# Patient Record
Sex: Female | Born: 1965 | Race: Black or African American | Hispanic: No | Marital: Married | Smoking: Current every day smoker
Health system: Southern US, Community
[De-identification: ages and names within clinical notes are randomized; demographics above are authoritative.]

## PROBLEM LIST (undated history)

## (undated) DIAGNOSIS — K829 Disease of gallbladder, unspecified: Secondary | ICD-10-CM

## (undated) DIAGNOSIS — D649 Anemia, unspecified: Secondary | ICD-10-CM

## (undated) DIAGNOSIS — M1990 Unspecified osteoarthritis, unspecified site: Secondary | ICD-10-CM

## (undated) DIAGNOSIS — R51 Headache: Secondary | ICD-10-CM

## (undated) DIAGNOSIS — G8929 Other chronic pain: Secondary | ICD-10-CM

## (undated) DIAGNOSIS — E039 Hypothyroidism, unspecified: Secondary | ICD-10-CM

## (undated) DIAGNOSIS — M549 Dorsalgia, unspecified: Secondary | ICD-10-CM

## (undated) DIAGNOSIS — M7989 Other specified soft tissue disorders: Secondary | ICD-10-CM

## (undated) DIAGNOSIS — M2550 Pain in unspecified joint: Secondary | ICD-10-CM

## (undated) DIAGNOSIS — M2540 Effusion, unspecified joint: Secondary | ICD-10-CM

## (undated) DIAGNOSIS — M069 Rheumatoid arthritis, unspecified: Secondary | ICD-10-CM

## (undated) DIAGNOSIS — I10 Essential (primary) hypertension: Secondary | ICD-10-CM

## (undated) DIAGNOSIS — E559 Vitamin D deficiency, unspecified: Secondary | ICD-10-CM

## (undated) HISTORY — DX: Essential (primary) hypertension: I10

## (undated) HISTORY — PX: ABDOMINAL HYSTERECTOMY: SHX81

## (undated) HISTORY — DX: Hypothyroidism, unspecified: E03.9

## (undated) HISTORY — DX: Rheumatoid arthritis, unspecified: M06.9

## (undated) HISTORY — PX: GALLBLADDER SURGERY: SHX652

## (undated) HISTORY — DX: Unspecified osteoarthritis, unspecified site: M19.90

## (undated) HISTORY — DX: Anemia, unspecified: D64.9

## (undated) HISTORY — PX: KNEE ARTHROSCOPY: SUR90

## (undated) HISTORY — DX: Vitamin D deficiency, unspecified: E55.9

## (undated) HISTORY — DX: Dorsalgia, unspecified: M54.9

## (undated) HISTORY — DX: Other specified soft tissue disorders: M79.89

## (undated) HISTORY — DX: Disease of gallbladder, unspecified: K82.9

---

## 1997-08-06 ENCOUNTER — Encounter: Admission: RE | Admit: 1997-08-06 | Discharge: 1997-11-04 | Payer: Self-pay | Admitting: Internal Medicine

## 1998-01-17 ENCOUNTER — Encounter: Admission: RE | Admit: 1998-01-17 | Discharge: 1998-04-17 | Payer: Self-pay | Admitting: Internal Medicine

## 1998-10-23 ENCOUNTER — Encounter: Payer: Self-pay | Admitting: Emergency Medicine

## 1998-10-23 ENCOUNTER — Emergency Department (HOSPITAL_COMMUNITY): Admission: EM | Admit: 1998-10-23 | Discharge: 1998-10-23 | Payer: Self-pay | Admitting: Emergency Medicine

## 1998-12-12 ENCOUNTER — Encounter (INDEPENDENT_AMBULATORY_CARE_PROVIDER_SITE_OTHER): Payer: Self-pay

## 1998-12-12 ENCOUNTER — Inpatient Hospital Stay (HOSPITAL_COMMUNITY): Admission: AD | Admit: 1998-12-12 | Discharge: 1998-12-14 | Payer: Self-pay | Admitting: *Deleted

## 1999-12-19 ENCOUNTER — Other Ambulatory Visit: Admission: RE | Admit: 1999-12-19 | Discharge: 1999-12-19 | Payer: Self-pay | Admitting: *Deleted

## 2002-08-21 ENCOUNTER — Other Ambulatory Visit: Admission: RE | Admit: 2002-08-21 | Discharge: 2002-08-21 | Payer: Self-pay | Admitting: *Deleted

## 2003-10-26 ENCOUNTER — Other Ambulatory Visit: Admission: RE | Admit: 2003-10-26 | Discharge: 2003-10-26 | Payer: Self-pay | Admitting: Obstetrics and Gynecology

## 2004-09-06 ENCOUNTER — Encounter: Admission: RE | Admit: 2004-09-06 | Discharge: 2004-09-06 | Payer: Self-pay | Admitting: Internal Medicine

## 2006-08-12 ENCOUNTER — Encounter (INDEPENDENT_AMBULATORY_CARE_PROVIDER_SITE_OTHER): Payer: Self-pay | Admitting: Diagnostic Radiology

## 2006-08-12 ENCOUNTER — Other Ambulatory Visit: Admission: RE | Admit: 2006-08-12 | Discharge: 2006-08-12 | Payer: Self-pay | Admitting: Diagnostic Radiology

## 2006-08-12 ENCOUNTER — Encounter: Admission: RE | Admit: 2006-08-12 | Discharge: 2006-08-12 | Payer: Self-pay | Admitting: Endocrinology

## 2006-12-10 ENCOUNTER — Ambulatory Visit (HOSPITAL_BASED_OUTPATIENT_CLINIC_OR_DEPARTMENT_OTHER): Admission: RE | Admit: 2006-12-10 | Discharge: 2006-12-10 | Payer: Self-pay | Admitting: Orthopedic Surgery

## 2007-05-06 ENCOUNTER — Encounter: Admission: RE | Admit: 2007-05-06 | Discharge: 2007-05-06 | Payer: Self-pay | Admitting: Endocrinology

## 2007-06-07 ENCOUNTER — Encounter (INDEPENDENT_AMBULATORY_CARE_PROVIDER_SITE_OTHER): Payer: Self-pay | Admitting: Interventional Radiology

## 2007-06-07 ENCOUNTER — Encounter: Admission: RE | Admit: 2007-06-07 | Discharge: 2007-06-07 | Payer: Self-pay | Admitting: Endocrinology

## 2007-06-07 ENCOUNTER — Other Ambulatory Visit: Admission: RE | Admit: 2007-06-07 | Discharge: 2007-06-07 | Payer: Self-pay | Admitting: Interventional Radiology

## 2007-08-19 ENCOUNTER — Ambulatory Visit (HOSPITAL_BASED_OUTPATIENT_CLINIC_OR_DEPARTMENT_OTHER): Admission: RE | Admit: 2007-08-19 | Discharge: 2007-08-19 | Payer: Self-pay | Admitting: Orthopedic Surgery

## 2009-04-30 ENCOUNTER — Encounter: Admission: RE | Admit: 2009-04-30 | Discharge: 2009-04-30 | Payer: Self-pay | Admitting: Endocrinology

## 2010-02-23 ENCOUNTER — Encounter: Payer: Self-pay | Admitting: Endocrinology

## 2010-02-24 ENCOUNTER — Encounter: Payer: Self-pay | Admitting: Endocrinology

## 2010-06-17 NOTE — Op Note (Signed)
NAMECLINTON, WAHLBERG NO.:  [id number]   MEDICAL RECORD NO.:  [id number]          PATIENT TYPE:  AMB   LOCATION:  DSC                          FACILITY:  [facility]   PHYSICIAN:  Harvie Junior, M.D.   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   POSTOPERATIVE DIAGNOSES:  1. 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  2. Tight lateral retinaculum.   PRINCIPAL PROCEDURES:  1. Right knee arthroscopy with debridement of posterior horn medial      meniscus and corresponding debridement in the medial femoral      compartment.  2. Debridement of chondromalacia of patella.  3. Lateral retinacular release, arthroscopic.   SURGEON:  Harvie Junior, M.D.   ASSISTANT:  Marshia Ly, P.A.   ANESTHESIA:  General.   BRIEF HISTORY:  Ms. Altizer is a 45 year old female with a long history of  having had significant right knee pain.  We treated her conservatively  for a long period of time.  Because of continuing complaints of pain, we  ultimately talked about treatment options including arthroscopic  intervention.  MRI was not obtained as preoperatively we did not feel it  was going to add much to the case.  Ultimately we felt she had a medial  meniscal tear and some chondromalacia of the patella.  She was brought  to the operating room  for arthroscopic intervention.   PROCEDURE:  The patient was brought to the operating room.  After  adequate anesthesia was obtained with general anesthetic, the patient  was placed up on the operating table.  The right leg was prepped and  draped in the usual sterile fashion.  Following this, routine  arthroscopic examination of the knee revealed there was an obvious  posterior horn medial meniscal tear, radial in nature.  This was  debrided back to a smooth and  stable rim.   Attention was turned to the medial femoral condyle which had some grade  2 and grade 3 changes, it was debrided to a smooth rim of articular  cartilage.  Attention was turned to the notch where the ACL was normal.  Lateral side showed some grade 1 and grade 2 chondromalacia.  The  lateral meniscus was within normal limits, this was left alone.   Attention was turned up to the patellofemoral joint where there was some  grade 3 chondromalacia that was debrided back to a stable rim and then  attention was turned to the lateral retinaculum where the lateral  retinaculum was identified and released from 3 cm proximal to the  patella down to the joint line.  Once this was completed, the knee  was copiously and thoroughly irrigated and suctioned dry.  Arthroscopic  portals were closed with a bandage.  A sterile compression dressing was  applied.  The patient was taken to the recovery where she was noted to  be in satisfactory condition.  Estimated blood loss  through the  procedure was nothing.      Harvie Junior, M.D.  Electronically Signed     JLG/MEDQ  D:  12/10/2006  T:  12/10/2006  Job:  [id number]   cc:   Harvie Junior, M.D.

## 2010-06-17 NOTE — Op Note (Signed)
[name], [name] NO.:  [id number]   MEDICAL RECORD NO.:  [id number]          PATIENT TYPE:  AMB   LOCATION:  DSC                          FACILITY:  [facility]   PHYSICIAN:  Harvie Junior, M.D.   DATE OF BIRTH:  07/07/1965   DATE OF PROCEDURE:  DATE OF DISCHARGE:                               OPERATIVE REPORT   PREOPERATIVE DIAGNOSES:  1. Medial meniscus tear  2. Tight right lateral retinaculum.   POSTOPERATIVE DIAGNOSES:  1. Medial meniscus tear  2. Tight right lateral retinaculum.  3. Chondromalacia patella.  4. Chondromalacia lateral femoral condyle.   PROCEDURE:  1. Operative knee arthroscopy.  Partial and posterior horn medial      meniscectomy.  2. Chondroplasty lateral compartment.  3. Chondroplasty patellofemoral compartment.  4. Lateral retinacular release.   SURGEON:  Harvie Junior, MD   ASSISTANT:  None.   ANESTHESIA:  General.   BRIEF HISTORY:  [name] Hughes is a 45 year old female with a long history of  having had significant bilateral knee pain.  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  Her left knee began hurting as similar to the right knee  and we talked about conservative treatment options, but ultimately she  felt that surgical intervention had worked well for her before, and she  was brought to the operating room for this procedure.   PROCEDURE:  The patient was brought to the operating room and after  adequate anesthesia was obtained with general anesthetic, the patient  was placed supine on the operating table.  The left leg was then prepped  and draped in the usual sterile fashion.  Following this, routine  arthroscopic examination of the knee revealed there was an obvious  posterior horn medial meniscal tear.  This was at the meniscal root and  one around to the posterior horn.  About 20% of the posterior horn of  the medial meniscus was removed down to a  smooth and stable rim.  There  was grade 2 and 3 change on the medial femoral condyle, which was  debrided back to a smooth and stable rim.  ACL was normal.  Lateral side  showed a small area of grade 2 change, which was really almost like a  flap, which was derided back to a smooth and stable rim.  Lateral  meniscus was normal.  Attention was turned out to the patellofemoral  joint.  There were grade 3 and 4 changes on the posterior aspect of the  patella and in the trochlea.  Because of this severe disease, we felt  that the lateral retinacular release was appropriate, and lateral  retinacular release was performed from the lateral compartment to help  centralize the patellar tracking and take pressure off the  patellofemoral joint.  This was performed from 3 fingerbreadths proximal  to the patella down to the joint line.  Once this was completed, the  knee was copiously and thoroughly irrigated and suctioned dried.  All  sterile portals were closed with a  bandage.  Final check was made for  loosening fragment and pieces, seeing none.  The knee was copiously and  thoroughly irrigated and suctioned dried.  All sterile portals were  closed with a bandage.  Sterile compression dressing was applied.  The  patient was taken to the recovery room.  She was noted to be in a  satisfactory condition.  Prior to bandage placement, 20 mL of 0.25%  Marcaine was instilled into the knee for postoperative anesthesia.      Harvie Junior, M.D.  Electronically Signed     JLG/MEDQ  D:  08/19/2007  T:  08/20/2007  Job:  [id number]

## 2010-06-20 NOTE — H&P (Signed)
[facility]  Patient:    [name] Hughes                         MRN: [id number] Adm. Date:  [id number] Attending:  Pleas Koch                         History and Physical  ADMISSION DIAGNOSIS:          Fibroid uterus and menorrhagia.  HISTORY OF PRESENT ILLNESS:   This is a 45 year old, gravida 1, para 1, status ost tubal ligation with a long history of fibroids dating back to 41.  She had submucous myoma resection with resectoscope in 1993.  She had recurrence of the  fibroids with heavy bleeding after deliverng an infant in 1995.  Bleeding was not controlled with oral contraceptives which caused a moodiness side effects and still irregular bleeding.  An ultrasound showed multiple fibroids with an endometrial  stripe of 7.5 mm.  The patient desired definitive therapy.  A total abdominal hysterectomy is scheduled.  PAST MEDICAL HISTORY:         Cholecystectomy, postpartum tubal ligation and cesarean section in 1995.  Submucous myoma resection in 1993.  No other active medical problems.  FAMILY HISTORY:               Positive diabetes and positive stroke.  ALLERGIES:                    No known drug allergies.  MEDICATIONS:                  None.  REVIEW OF SYSTEMS:            Otherwise negative.  SOCIAL HISTORY:               No smoking or alcohol.  No recreational drugs. The patient is of the [place] faith.  PHYSICAL EXAMINATION:  VITAL SIGNS:                  Temperature 97.9, blood pressure 110/80, respiratory rate 16, pulse rate 80.  HEENT:                        Normal.  GENERAL:                      The patient is a well-developed, obese black female.  NECK:                         No thyromegaly or JVD.  LUNGS:                        Clear to auscultation and percussion.  HEART:                        Regular rate and rhythm with no murmurs.  BREASTS:                      Without masses.  ABDOMEN:                       Obese, no masses, no CVA tenderness, no organomegaly and no adenopathy.  PELVIC:  Normal external genitals, normal vagina, and normal cervix.  Uterus 8 to 10 weeks size, irregular, mobile, consistent with fibroids. No adnexal masses.  No rectal masses.  EXTREMITIES:                  Without cyanosis or edema.  SKIN:                         Normal.  LABORATORY DATA:              Admission hemoglobin 11.6, WBC 8.7, platelet count 291,000.  Urinalysis negative.  ADMISSION DIAGNOSIS:          Fibroid uterus, menorrhagia, and anemia.  PLAN:                         The patient is admitted for total abdominal hysterectomy, aware of risks and complications of surgery including complications due to morbid obesity, complications due to bleeding and infections, bowel and bladder injuries, fistula formation, anesthetic complications, and blood clots.  She accepts these and is willing to proceed.  Surgery is scheduled for December 12, 1998. DD:  12/12/98 TD:  12/12/98 Job: [id number]

## 2010-06-20 NOTE — Op Note (Signed)
[facility]  Patient:    [name] Hughes                         MRN: [id number] Proc. Date: 12/12/98 Adm. Date:  [id number] Attending:  Pleas Koch                           Operative Report  PREOPERATIVE DIAGNOSIS:       Menorrhagia, fibroid uterus.  POSTOPERATIVE DIAGNOSIS:      Menorrhagia, fibroid uterus.  OPERATION:                    Total abdominal hysterectomy.  SURGEON:                      Georgina Peer, M.D.  ASSISTANT:                    Henreitta Leber, P.A.-C.  ANESTHESIA:                   General anesthesia by Belva Agee, M.D.  ESTIMATED BLOOD LOSS:         300 cc.  COMPLICATIONS:                None.  On-Q pump used using 0.25% Marcaine.  FINDINGS:                     8-week size fibroid uterus, normal ovaries.  INDICATIONS:                  A 45 year old, gravida 1, para 1, with a long history of fibroids, symptomatic menorrhagia unresponsive to medical treatment.  She is  brought in for abdominal hysterectomy.  DESCRIPTION OF PROCEDURE:     The patient was taken to the operating room and given a general anesthesia and placed in the dorsal supine position.  Her abdomen, perineum, and vagina were prepped and draped and a Foley catheter placed in her  bladder.  A transverse skin incision was made through the previous Pfannenstiel  incision from her cesarean section.  This was taken down through the subcutaneous Scarpas layer and the fascia was identified.  Bleeders were cauterized.  The fascia was divided and the muscles separated in the midline.  The peritoneum opened sharply.  The peritoneal incision was widened and there was a small omental adhesion from the subumbilical incision which was lysed.  There was no evidence of bowel injury.  The tubes and ovaries appeared normal.  The tubal stump was adherent to a portion of the descending colon and this was dissected free.  The uterus was mobile.   Multiple fibroids were noted.  The bowel was packed away and the patient placed in Trendelenburg.  Balfour retractors were placed prior to this and the bladder blade placed.  The uterus was elevated with two long Kelly clamps, the right round ligament clamped, cut, and suture ligated.  The right utero-ovarian  ligament isolated, clamped, cut, and suture ligated, and free tied.  The uterine vessels on the right were skeletonized and the bladder flap was created and the  bladder pushed away.  The left round ligament was clamped, cut, and suture ligated. The left utero-ovarian ligament was clamped, cut, and suture ligated and then free tied.  The uterine vessels were skeletonized on the left.  The bladder flap was met  in the midline and the bladder pushed down and cervical fascia noted.  The right uterine vessels were identified, clamped with a bag clamp, cut, and suture ligated. The left uterine vessels were identified, clamped, cut, and suture ligated. Progressive bites of the cardinal ligament incorporating the vascular structures were clamped, cut, and suture ligated.  Because the uterus was bulky and visualization was more difficult, once the uterine vessels had been amputated, he fundus of the uterus was amputated and the cervical stump remained.  The uterosacral ligaments were identified, clamped, cut, and suture ligated at the level of the cervicovaginal angles.  The clamps entered the vagina and the cervical stump was cut free of the vaginal apex using curved scissors.  The corner sutures of Vicryl used to secure the corners of the midportion was whip stitched with a  running locked suture of Vicryl and closed in the middle with a figure-of-eight  suture.  There was excellent hemostasis.  The pelvis was irrigated.  At this point, the uterosacral ligaments were tied together.  The ovaries were found to be well suspended.  There was no bleeding.  Blood and debris were  removed.  The packs and retractors were removed.  The muscles approximated with two interrupted sutures. The fascia closed with two sutures of Vicryl and tied in the midline.  The subcutaneous tissue and Scarpas was freed where it was previously adherent.  An  On-Q catheter was placed three fingerbreadths above the right edge of the incision. 30 cc of 0.25% Marcaine was injected into the subcutaneous tissue and subcuticular area.  An On-Q catheter was threaded through the angiocath insertion port and connected to an On-Q pump per On-Q protocol.  The subcutaneous tissue was closed with a running suture of chromic and the skin closed with skin staples.  The patient had received 1 gram of Ancef preoperatively and PAS stockings knee-high  were in place the total time of the operation.  Sponge, needle, and instrument counts were correct at all times.  The patient was returned to the recovery room in good condition. DD:  12/12/98 TD:  12/12/98 Job: [id number]

## 2010-06-26 ENCOUNTER — Other Ambulatory Visit: Payer: Self-pay | Admitting: Endocrinology

## 2010-06-26 DIAGNOSIS — E041 Nontoxic single thyroid nodule: Secondary | ICD-10-CM

## 2010-10-31 LAB — POCT I-STAT, CHEM 8
BUN: 9
Calcium, Ion: 1.17
Chloride: 101
Creatinine, Ser: 0.7
Glucose, Bld: 89

## 2010-10-31 LAB — POCT HEMOGLOBIN-HEMACUE: Hemoglobin: 11.8 — ABNORMAL LOW

## 2010-11-11 LAB — POCT HEMOGLOBIN-HEMACUE
Hemoglobin: 12.1
Operator id: 128471

## 2010-11-11 LAB — BASIC METABOLIC PANEL
BUN: 8
CO2: 30
Chloride: 104
GFR calc Af Amer: >60
Potassium: 3.1 — ABNORMAL LOW

## 2010-12-15 ENCOUNTER — Other Ambulatory Visit: Payer: Self-pay

## 2013-06-29 ENCOUNTER — Ambulatory Visit: Payer: Self-pay | Admitting: Cardiovascular Disease

## 2013-07-21 ENCOUNTER — Encounter: Payer: Self-pay | Admitting: Cardiovascular Disease

## 2013-07-21 ENCOUNTER — Ambulatory Visit (INDEPENDENT_AMBULATORY_CARE_PROVIDER_SITE_OTHER): Payer: Managed Care, Other (non HMO) | Admitting: Cardiovascular Disease

## 2013-07-21 VITALS — BP 90/66 | HR 64 | Ht 65.5 in | Wt 279.0 lb

## 2013-07-21 DIAGNOSIS — R079 Chest pain, unspecified: Secondary | ICD-10-CM

## 2013-07-21 DIAGNOSIS — I10 Essential (primary) hypertension: Secondary | ICD-10-CM | POA: Diagnosis not present

## 2013-07-21 NOTE — Assessment & Plan Note (Signed)
Under good control on current medications 

## 2013-07-21 NOTE — Patient Instructions (Signed)
Your physician recommends that you schedule a follow-up appointment in: As Needed    

## 2013-07-21 NOTE — Assessment & Plan Note (Signed)
She relates new onset chest pain during the month of May  once or twice a week occurring up to 30 minutes a time without associated symptoms. She really has no risk factors other than hypertension. She's had no recurrent symptoms. At this point I'm not convinced that her pain is ischemic. Will not pursue further workup unless she has recurrent symptoms.

## 2013-07-21 NOTE — Progress Notes (Signed)
     07/21/2013 [name] AlstromJanice D Hughes   1965/05/26  [id number]  Primary Physician Londell MohPHARR,[name] DAVIDSON, Hughes Primary Cardiologist: [name] GessJonathan J. Berry Hughes [name] RenoFACP,FACC,FAHA, FSCAI   HPI:  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 She was referred by Dr. Renne CriglerPharr for cardiovascular evaluation because of atypical chest pain. Her cardiovascular risk factor profile is only positive for hypertension and tobacco abuse smoking one half pack per day. There is no family history. She's not diabetic or hyperlipidemic. She's never had a heart for stroke. She had new-onset chest pain during the month of May Kring wants to person month lasting up to 30 minutes a time without associated symptoms. She has had no recurrent symptoms.   Current Outpatient Prescriptions  Medication Sig Dispense Refill  . SYNTHROID 112 MCG tablet Take 112 mcg by mouth daily before breakfast.       . valsartan-hydrochlorothiazide (DIOVAN-HCT) 320-25 MG per tablet Take 1 tablet by mouth daily.        No current facility-administered medications for this visit.    No Known Allergies  History   Social History  . Marital Status: Married    Spouse Name: N/A    Number of Children: N/A  . Years of Education: N/A   Occupational History  . Not on file.   Social History Main Topics  . Smoking status: Current Every Day Smoker  . Smokeless tobacco: Not on file  . Alcohol Use: Not on file  . Drug Use: Not on file  . Sexual Activity: Not on file   Other Topics Concern  . Not on file   Social History Narrative  . No narrative on file     Review of Systems: General: negative for chills, fever, night sweats or weight changes.  Cardiovascular: negative for chest pain, dyspnea on exertion, edema, orthopnea, palpitations, paroxysmal nocturnal dyspnea or shortness of breath Dermatological: negative for rash Respiratory: negative for cough or wheezing Urologic:  negative for hematuria Abdominal: negative for nausea, vomiting, diarrhea, bright red blood per rectum, melena, or hematemesis Neurologic: negative for visual changes, syncope, or dizziness All other systems reviewed and are otherwise negative except as noted above.    Blood pressure 90/66, pulse 64, height 5' 5.5" (1.664 m), weight 279 lb (126.554 kg).  General appearance: alert and no distress Neck: no adenopathy, no carotid bruit, no JVD, supple, symmetrical, trachea midline and thyroid not enlarged, symmetric, no tenderness/mass/nodules Lungs: clear to auscultation bilaterally Heart: regular rate and rhythm, S1, S2 normal, no murmur, click, rub or gallop Extremities: extremities normal, atraumatic, no cyanosis or edema and 2+ pedal pulses bilaterally  EKG /was 64 with no ST or T wave changes  ASSESSMENT AND PLAN:   Essential hypertension Under good control on current medications  Chest pain She relates new onset chest pain during the month of May  once or twice a week occurring up to 30 minutes a time without associated symptoms. She really has no risk factors other than hypertension. She's had no recurrent symptoms. At this point I'm not convinced that her pain is ischemic. Will not pursue further workup unless she has recurrent symptoms.      [name] GessJonathan J. Berry Hughes FACP,FACC,FAHA, [facility] CampusFSCAI 07/21/2013 4:45 PM

## 2013-09-19 ENCOUNTER — Other Ambulatory Visit: Payer: Self-pay | Admitting: Orthopedic Surgery

## 2013-09-22 ENCOUNTER — Encounter (HOSPITAL_COMMUNITY): Payer: Self-pay | Admitting: Pharmacy Technician

## 2013-09-28 ENCOUNTER — Encounter (HOSPITAL_COMMUNITY)
Admission: RE | Admit: 2013-09-28 | Discharge: 2013-09-28 | Disposition: A | Discharge status: Home / Self Care | Payer: Managed Care, Other (non HMO) | Source: Ambulatory Visit | Attending: Orthopedic Surgery | Admitting: Orthopedic Surgery

## 2013-09-28 ENCOUNTER — Encounter (HOSPITAL_COMMUNITY): Payer: Self-pay

## 2013-09-28 DIAGNOSIS — M1710 Unilateral primary osteoarthritis, unspecified knee: Secondary | ICD-10-CM | POA: Diagnosis present

## 2013-09-28 DIAGNOSIS — Z01818 Encounter for other preprocedural examination: Secondary | ICD-10-CM | POA: Insufficient documentation

## 2013-09-28 HISTORY — DX: Dorsalgia, unspecified: M54.9

## 2013-09-28 HISTORY — DX: Headache: R51

## 2013-09-28 HISTORY — DX: Unspecified osteoarthritis, unspecified site: M19.90

## 2013-09-28 HISTORY — DX: Pain in unspecified joint: M25.50

## 2013-09-28 HISTORY — DX: Effusion, unspecified joint: M25.40

## 2013-09-28 HISTORY — DX: Other chronic pain: G89.29

## 2013-09-28 LAB — URINALYSIS, ROUTINE W REFLEX MICROSCOPIC
BILIRUBIN URINE: NEGATIVE
Glucose, UA: NEGATIVE mg/dL
HGB URINE DIPSTICK: NEGATIVE
Ketones, ur: NEGATIVE mg/dL
Leukocytes, UA: NEGATIVE
Nitrite: NEGATIVE
PROTEIN: NEGATIVE mg/dL
Specific Gravity, Urine: 1.021 (ref 1.005–1.030)
UROBILINOGEN UA: 0.2 mg/dL (ref 0.0–1.0)
pH: 6 (ref 5.0–8.0)

## 2013-09-28 LAB — CBC WITH DIFFERENTIAL/PLATELET
BASOS PCT: 0 % (ref 0–1)
Basophils Absolute: 0 10*3/uL (ref 0.0–0.1)
EOS ABS: 0.2 10*3/uL (ref 0.0–0.7)
EOS PCT: 3 % (ref 0–5)
HCT: 35.4 % — ABNORMAL LOW (ref 36.0–46.0)
HEMOGLOBIN: 11.8 g/dL — AB (ref 12.0–15.0)
LYMPHS ABS: 2.6 10*3/uL (ref 0.7–4.0)
Lymphocytes Relative: 34 % (ref 12–46)
MCH: 28.5 pg (ref 26.0–34.0)
MCHC: 33.3 g/dL (ref 30.0–36.0)
MCV: 85.5 fL (ref 78.0–100.0)
MONO ABS: 0.6 10*3/uL (ref 0.1–1.0)
MONOS PCT: 8 % (ref 3–12)
NEUTROS PCT: 55 % (ref 43–77)
Neutro Abs: 4.2 10*3/uL (ref 1.7–7.7)
Platelets: 209 10*3/uL (ref 150–400)
RBC: 4.14 MIL/uL (ref 3.87–5.11)
RDW: 13.2 % (ref 11.5–15.5)
WBC: 7.6 10*3/uL (ref 4.0–10.5)

## 2013-09-28 LAB — COMPREHENSIVE METABOLIC PANEL
ALBUMIN: 3 g/dL — AB (ref 3.5–5.2)
ALT: 12 U/L (ref 0–35)
AST: 16 U/L (ref 0–37)
Alkaline Phosphatase: 98 U/L (ref 39–117)
Anion gap: 11 (ref 5–15)
BUN: 11 mg/dL (ref 6–23)
CALCIUM: 9.3 mg/dL (ref 8.4–10.5)
CO2: 28 mEq/L (ref 19–32)
CREATININE: 0.86 mg/dL (ref 0.50–1.10)
Chloride: 98 mEq/L (ref 96–112)
GFR calc Af Amer: >90 mL/min (ref >90)
GFR calc non Af Amer: 79 mL/min — ABNORMAL LOW (ref >90)
GLUCOSE: 89 mg/dL (ref 70–99)
POTASSIUM: 3.8 meq/L (ref 3.7–5.3)
Sodium: 137 mEq/L (ref 137–147)
Total Bilirubin: 0.3 mg/dL (ref 0.3–1.2)
Total Protein: 7.3 g/dL (ref 6.0–8.3)

## 2013-09-28 LAB — APTT: aPTT: 33 seconds (ref 24–37)

## 2013-09-28 LAB — ABO/RH: ABO/RH(D): O POS

## 2013-09-28 LAB — SURGICAL PCR SCREEN
MRSA, PCR: NEGATIVE
Staphylococcus aureus: NEGATIVE

## 2013-09-28 LAB — TYPE AND SCREEN
ABO/RH(D): O POS
ANTIBODY SCREEN: NEGATIVE

## 2013-09-28 LAB — PROTIME-INR
INR: 1.08 (ref 0.00–1.49)
PROTHROMBIN TIME: 14 s (ref 11.6–15.2)

## 2013-09-28 MED ORDER — CHLORHEXIDINE GLUCONATE 4 % EX LIQD: 60.0000 mL | Freq: Once | CUTANEOUS | Status: DC

## 2013-09-28 NOTE — Pre-Procedure Instructions (Signed)
[name] Hughes  09/28/2013   Your procedure is scheduled on: Fri, Sept 4 @ 10:00 AM  Report to [facility]ance A at 8:00 AM.  Call this number if you have problems the morning of surgery: [phone]   Remember:   Do not eat food or drink liquids after midnight.   Take these medicines the morning of surgery with A SIP OF WATER: Pain Pill(if needed) and Synthroid              Stop taking your Ibuprofen. No Goody's,BC's,Aleve,Aspirin,Fish Oil,or any Herbal Medications   Do not wear jewelry, make-up or nail polish.  Do not wear lotions, powders, or perfumes. You may wear deodorant.  Do not shave 48 hours prior to surgery.   Do not bring valuables to the hospital.  [facility] is not responsible                  for any belongings or valuables.               Contacts, dentures or bridgework may not be worn into surgery.  Leave suitcase in the car. After surgery it may be brought to your room.  For patients admitted to the hospital, discharge time is determined by your                treatment team.               Special Instructions:  [place] - Preparing for Surgery  Before surgery, you can play an important role.  Because skin is not sterile, your skin needs to be as free of germs as possible.  You can reduce the number of germs on you skin by washing with CHG (chlorahexidine gluconate) soap before surgery.  CHG is an antiseptic cleaner which kills germs and bonds with the skin to continue killing germs even after washing.  Please DO NOT use if you have an allergy to CHG or antibacterial soaps.  If your skin becomes reddened/irritated stop using the CHG and inform your nurse when you arrive at Short Stay.  Do not shave (including legs and underarms) for at least 48 hours prior to the first CHG shower.  You may shave your face.  Please follow these instructions carefully:   1.  Shower with CHG Soap the night before surgery and the                                morning of  Surgery.  2.  If you choose to wash your hair, wash your hair first as usual with your       normal shampoo.  3.  After you shampoo, rinse your hair and body thoroughly to remove the                      Shampoo.  4.  Use CHG as you would any other liquid soap.  You can apply chg directly       to the skin and wash gently with scrungie or a clean washcloth.  5.  Apply the CHG Soap to your body ONLY FROM THE NECK DOWN.        Do not use on open wounds or open sores.  Avoid contact with your eyes,       ears, mouth and genitals (private parts).  Wash genitals (private parts)       with your  normal soap.  6.  Wash thoroughly, paying special attention to the area where your surgery        will be performed.  7.  Thoroughly rinse your body with warm water from the neck down.  8.  DO NOT shower/wash with your normal soap after using and rinsing off       the CHG Soap.  9.  Pat yourself dry with a clean towel.            10.  Wear clean pajamas.            11.  Place clean sheets on your bed the night of your first shower and do not        sleep with pets.  Day of Surgery  Do not apply any lotions/deoderants the morning of surgery.  Please wear clean clothes to the hospital/surgery center.     Please read over the following fact sheets that you were given: Pain Booklet, Coughing and Deep Breathing, Blood Transfusion Information, MRSA Information and Surgical Site Infection Prevention

## 2013-09-28 NOTE — Progress Notes (Addendum)
Saw Dr.Berry in June 2015-visit in epic but doesn't have to be seen again  Denies ever having an echo/stress test/heart cath  Denies CXR in past yr    EKG in epic from 07-21-13  Medical Md is Dr.Walter Renne Crigler

## 2013-10-05 MED ORDER — DEXTROSE 5 % IV SOLN
3.0000 g | INTRAVENOUS | Status: AC
  Administered 2013-10-06: 3 g via INTRAVENOUS
  Filled 2013-10-05: qty 3000

## 2013-10-05 NOTE — Progress Notes (Signed)
Instructed patient to arrive at 530 am 10-06-13.

## 2013-10-05 NOTE — Anesthesia Preprocedure Evaluation (Addendum)
Anesthesia Evaluation  Patient identified by MRN, date of birth, ID band Patient awake    Reviewed: Allergy & Precautions, H&P , NPO status , Patient's Chart, lab work & pertinent test results  History of Anesthesia Complications Negative for: history of anesthetic complications  Airway Mallampati: II TM Distance: >3 FB Neck ROM: Full    Dental  (+) Teeth Intact, Dental Advisory Given   Pulmonary Current Smoker,  breath sounds clear to auscultation        Cardiovascular hypertension, Pt. 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)   Renal/GU negative Renal ROS     Musculoskeletal   Abdominal   Peds  Hematology   Anesthesia Other Findings   Reproductive/Obstetrics negative OB ROS                         Anesthesia Physical Anesthesia Plan  ASA: III  Anesthesia Plan: General   Post-op Pain Management: MAC Combined w/ Regional for Post-op pain   Induction: Intravenous  Airway Management Planned: Oral ETT  Additional Equipment:   Intra-op Plan:   Post-operative Plan:   Informed Consent: I have reviewed the patients History and Physical, chart, labs and discussed the procedure including the risks, benefits and alternatives for the proposed anesthesia with the patient or authorized representative who has indicated his/her understanding and acceptance.     Plan Discussed with:   Anesthesia Plan Comments:         Anesthesia Quick Evaluation

## 2013-10-06 ENCOUNTER — Encounter (HOSPITAL_COMMUNITY)
Admission: RE | Disposition: A | Discharge status: Home / Self Care | Payer: Self-pay | Source: Ambulatory Visit | Attending: Orthopedic Surgery

## 2013-10-06 ENCOUNTER — Encounter (HOSPITAL_COMMUNITY): Payer: Self-pay | Admitting: *Deleted

## 2013-10-06 ENCOUNTER — Inpatient Hospital Stay (HOSPITAL_COMMUNITY): Payer: Managed Care, Other (non HMO) | Admitting: Anesthesiology

## 2013-10-06 ENCOUNTER — Encounter (HOSPITAL_COMMUNITY): Payer: Managed Care, Other (non HMO) | Admitting: Anesthesiology

## 2013-10-06 ENCOUNTER — Inpatient Hospital Stay (HOSPITAL_COMMUNITY)
Admission: RE | Admit: 2013-10-06 | Discharge: 2013-10-07 | DRG: 470 | Disposition: A | Discharge status: Home / Self Care | Payer: Managed Care, Other (non HMO) | Source: Ambulatory Visit | Attending: Orthopedic Surgery | Admitting: Orthopedic Surgery

## 2013-10-06 DIAGNOSIS — E039 Hypothyroidism, unspecified: Secondary | ICD-10-CM | POA: Diagnosis present

## 2013-10-06 DIAGNOSIS — Z6841 Body Mass Index (BMI) 40.0 and over, adult: Secondary | ICD-10-CM | POA: Diagnosis not present

## 2013-10-06 DIAGNOSIS — M1710 Unilateral primary osteoarthritis, unspecified knee: Principal | ICD-10-CM | POA: Diagnosis present

## 2013-10-06 DIAGNOSIS — M1712 Unilateral primary osteoarthritis, left knee: Secondary | ICD-10-CM | POA: Diagnosis present

## 2013-10-06 DIAGNOSIS — F17200 Nicotine dependence, unspecified, uncomplicated: Secondary | ICD-10-CM | POA: Diagnosis present

## 2013-10-06 DIAGNOSIS — I10 Essential (primary) hypertension: Secondary | ICD-10-CM | POA: Diagnosis present

## 2013-10-06 DIAGNOSIS — M25569 Pain in unspecified knee: Secondary | ICD-10-CM | POA: Diagnosis present

## 2013-10-06 HISTORY — PX: TOTAL KNEE ARTHROPLASTY: SHX125

## 2013-10-06 SURGERY — ARTHROPLASTY, KNEE, TOTAL
Anesthesia: General | Site: Knee | Laterality: Left

## 2013-10-06 MED ORDER — LACTATED RINGERS IV SOLN
INTRAVENOUS | Status: DC | PRN
  Administered 2013-10-06 (×3): via INTRAVENOUS

## 2013-10-06 MED ORDER — BUPIVACAINE HCL (PF) 0.25 % IJ SOLN
INTRAMUSCULAR | Status: AC
  Filled 2013-10-06: qty 30

## 2013-10-06 MED ORDER — ONDANSETRON HCL 4 MG/2ML IJ SOLN: 4.0000 mg | Freq: Four times a day (QID) | INTRAMUSCULAR | Status: DC | PRN

## 2013-10-06 MED ORDER — KETAMINE HCL 10 MG/ML IJ SOLN
INTRAMUSCULAR | Status: DC | PRN
  Administered 2013-10-06 (×2): 20 mg via INTRAVENOUS

## 2013-10-06 MED ORDER — PROMETHAZINE HCL 25 MG/ML IJ SOLN: 12.5000 mg | Freq: Four times a day (QID) | INTRAMUSCULAR | Status: DC | PRN

## 2013-10-06 MED ORDER — POLYETHYLENE GLYCOL 3350 17 G PO PACK: 17.0000 g | PACK | Freq: Every day | ORAL | Status: DC | PRN

## 2013-10-06 MED ORDER — CEFUROXIME SODIUM 1.5 G IJ SOLR
INTRAMUSCULAR | Status: AC
Start: 2013-10-06 — End: 2013-10-06
  Filled 2013-10-06: qty 1.5

## 2013-10-06 MED ORDER — GLYCOPYRROLATE 0.2 MG/ML IJ SOLN
INTRAMUSCULAR | Status: DC | PRN
  Administered 2013-10-06: 0.4 mg via INTRAVENOUS

## 2013-10-06 MED ORDER — METHOCARBAMOL 500 MG PO TABS
500.0000 mg | ORAL_TABLET | Freq: Four times a day (QID) | ORAL | Status: DC | PRN
  Administered 2013-10-06 – 2013-10-07 (×3): 500 mg via ORAL
  Filled 2013-10-06 (×3): qty 1

## 2013-10-06 MED ORDER — DIPHENHYDRAMINE HCL 12.5 MG/5ML PO ELIX: 12.5000 mg | ORAL_SOLUTION | ORAL | Status: DC | PRN

## 2013-10-06 MED ORDER — DOCUSATE SODIUM 100 MG PO CAPS
100.0000 mg | ORAL_CAPSULE | Freq: Two times a day (BID) | ORAL | Status: DC
  Administered 2013-10-06 – 2013-10-07 (×3): 100 mg via ORAL
  Filled 2013-10-06 (×3): qty 1

## 2013-10-06 MED ORDER — ONDANSETRON HCL 4 MG PO TABS: 4.0000 mg | ORAL_TABLET | Freq: Four times a day (QID) | ORAL | Status: DC | PRN

## 2013-10-06 MED ORDER — ROCURONIUM BROMIDE 50 MG/5ML IV SOLN
INTRAVENOUS | Status: AC
  Filled 2013-10-06: qty 1

## 2013-10-06 MED ORDER — HYDROCHLOROTHIAZIDE 25 MG PO TABS
25.0000 mg | ORAL_TABLET | Freq: Every day | ORAL | Status: DC
  Administered 2013-10-06 – 2013-10-07 (×2): 25 mg via ORAL
  Filled 2013-10-06 (×3): qty 1

## 2013-10-06 MED ORDER — KETOROLAC TROMETHAMINE 30 MG/ML IJ SOLN
INTRAMUSCULAR | Status: DC | PRN
  Administered 2013-10-06: 30 mg via INTRAVENOUS

## 2013-10-06 MED ORDER — DEXAMETHASONE SODIUM PHOSPHATE 4 MG/ML IJ SOLN
INTRAMUSCULAR | Status: DC | PRN
  Administered 2013-10-06: 8 mg via INTRAVENOUS

## 2013-10-06 MED ORDER — SODIUM CHLORIDE 0.9 % IR SOLN
Status: DC | PRN
  Administered 2013-10-06: 1000 mL

## 2013-10-06 MED ORDER — DIPHENHYDRAMINE HCL 50 MG/ML IJ SOLN
INTRAMUSCULAR | Status: DC | PRN
  Administered 2013-10-06: 12.5 mg via INTRAVENOUS

## 2013-10-06 MED ORDER — CEFAZOLIN SODIUM-DEXTROSE 2-3 GM-% IV SOLR
2.0000 g | Freq: Four times a day (QID) | INTRAVENOUS | Status: AC
Start: 2013-10-06 — End: 2013-10-06
  Administered 2013-10-06 (×2): 2 g via INTRAVENOUS
  Filled 2013-10-06 (×3): qty 50

## 2013-10-06 MED ORDER — FENTANYL CITRATE 0.05 MG/ML IJ SOLN
INTRAMUSCULAR | Status: DC | PRN
  Administered 2013-10-06 (×5): 50 ug via INTRAVENOUS

## 2013-10-06 MED ORDER — MIDAZOLAM HCL 5 MG/5ML IJ SOLN
INTRAMUSCULAR | Status: DC | PRN
  Administered 2013-10-06 (×2): 1 mg via INTRAVENOUS

## 2013-10-06 MED ORDER — BUPIVACAINE-EPINEPHRINE (PF) 0.5% -1:200000 IJ SOLN
INTRAMUSCULAR | Status: DC | PRN
  Administered 2013-10-06: 30 mL via PERINEURAL

## 2013-10-06 MED ORDER — ACETAMINOPHEN 325 MG PO TABS: 650.0000 mg | ORAL_TABLET | Freq: Four times a day (QID) | ORAL | Status: DC | PRN

## 2013-10-06 MED ORDER — PHENYLEPHRINE 40 MCG/ML (10ML) SYRINGE FOR IV PUSH (FOR BLOOD PRESSURE SUPPORT)
PREFILLED_SYRINGE | INTRAVENOUS | Status: AC
  Filled 2013-10-06: qty 10

## 2013-10-06 MED ORDER — ALUM & MAG HYDROXIDE-SIMETH 200-200-20 MG/5ML PO SUSP: 30.0000 mL | ORAL | Status: DC | PRN

## 2013-10-06 MED ORDER — BISACODYL 10 MG RE SUPP: 10.0000 mg | Freq: Every day | RECTAL | Status: DC | PRN

## 2013-10-06 MED ORDER — PROMETHAZINE HCL 25 MG/ML IJ SOLN: 6.2500 mg | INTRAMUSCULAR | Status: DC | PRN

## 2013-10-06 MED ORDER — BUPIVACAINE LIPOSOME 1.3 % IJ SUSP
INTRAMUSCULAR | Status: DC | PRN
  Administered 2013-10-06: 20 mL

## 2013-10-06 MED ORDER — MEPERIDINE HCL 25 MG/ML IJ SOLN: 6.2500 mg | INTRAMUSCULAR | Status: DC | PRN

## 2013-10-06 MED ORDER — FENTANYL CITRATE 0.05 MG/ML IJ SOLN
INTRAMUSCULAR | Status: AC
  Filled 2013-10-06: qty 5

## 2013-10-06 MED ORDER — KETOROLAC TROMETHAMINE 15 MG/ML IJ SOLN
15.0000 mg | Freq: Four times a day (QID) | INTRAMUSCULAR | Status: AC
  Administered 2013-10-06 – 2013-10-07 (×4): 15 mg via INTRAVENOUS
  Filled 2013-10-06 (×4): qty 1

## 2013-10-06 MED ORDER — VALSARTAN-HYDROCHLOROTHIAZIDE 320-25 MG PO TABS: 1.0000 | ORAL_TABLET | Freq: Every day | ORAL | Status: DC

## 2013-10-06 MED ORDER — ASPIRIN EC 325 MG PO TBEC
325.0000 mg | DELAYED_RELEASE_TABLET | Freq: Two times a day (BID) | ORAL | Status: DC
  Administered 2013-10-06 – 2013-10-07 (×3): 325 mg via ORAL
  Filled 2013-10-06 (×4): qty 1

## 2013-10-06 MED ORDER — PROPOFOL 10 MG/ML IV BOLUS
INTRAVENOUS | Status: AC
  Filled 2013-10-06: qty 20

## 2013-10-06 MED ORDER — LIDOCAINE HCL (CARDIAC) 20 MG/ML IV SOLN
INTRAVENOUS | Status: AC
  Filled 2013-10-06: qty 5

## 2013-10-06 MED ORDER — HYDROMORPHONE HCL PF 1 MG/ML IJ SOLN: 1.0000 mg | INTRAMUSCULAR | Status: DC | PRN

## 2013-10-06 MED ORDER — ASPIRIN EC 325 MG PO TBEC: 325.0000 mg | DELAYED_RELEASE_TABLET | Freq: Two times a day (BID) | ORAL | Status: DC

## 2013-10-06 MED ORDER — SUCCINYLCHOLINE CHLORIDE 20 MG/ML IJ SOLN
INTRAMUSCULAR | Status: AC
  Filled 2013-10-06: qty 1

## 2013-10-06 MED ORDER — METHOCARBAMOL 750 MG PO TABS: 750.0000 mg | ORAL_TABLET | Freq: Three times a day (TID) | ORAL | Status: DC | PRN

## 2013-10-06 MED ORDER — NEOSTIGMINE METHYLSULFATE 10 MG/10ML IV SOLN
INTRAVENOUS | Status: DC | PRN
  Administered 2013-10-06: 3 mg via INTRAVENOUS

## 2013-10-06 MED ORDER — FENTANYL CITRATE 0.05 MG/ML IJ SOLN: 25.0000 ug | INTRAMUSCULAR | Status: DC | PRN

## 2013-10-06 MED ORDER — BUPIVACAINE HCL (PF) 0.25 % IJ SOLN
INTRAMUSCULAR | Status: DC | PRN
  Administered 2013-10-06: 20 mL

## 2013-10-06 MED ORDER — DEXMEDETOMIDINE HCL 200 MCG/2ML IV SOLN
INTRAVENOUS | Status: DC | PRN
  Administered 2013-10-06: 10 ug via INTRAVENOUS

## 2013-10-06 MED ORDER — IRBESARTAN 300 MG PO TABS
300.0000 mg | ORAL_TABLET | Freq: Every day | ORAL | Status: DC
  Administered 2013-10-06: 300 mg via ORAL
  Filled 2013-10-06 (×3): qty 1

## 2013-10-06 MED ORDER — SODIUM CHLORIDE 0.9 % IV SOLN
INTRAVENOUS | Status: DC
  Administered 2013-10-06: 100 mL via INTRAVENOUS

## 2013-10-06 MED ORDER — ONDANSETRON HCL 4 MG/2ML IJ SOLN
INTRAMUSCULAR | Status: DC | PRN
  Administered 2013-10-06: 4 mg via INTRAVENOUS

## 2013-10-06 MED ORDER — EPHEDRINE SULFATE 50 MG/ML IJ SOLN
INTRAMUSCULAR | Status: AC
  Filled 2013-10-06: qty 1

## 2013-10-06 MED ORDER — OXYCODONE-ACETAMINOPHEN 5-325 MG PO TABS: 1.0000 | ORAL_TABLET | Freq: Four times a day (QID) | ORAL | Status: DC | PRN

## 2013-10-06 MED ORDER — MIDAZOLAM HCL 2 MG/2ML IJ SOLN
INTRAMUSCULAR | Status: AC
  Filled 2013-10-06: qty 2

## 2013-10-06 MED ORDER — ROCURONIUM BROMIDE 100 MG/10ML IV SOLN
INTRAVENOUS | Status: DC | PRN
  Administered 2013-10-06: 50 mg via INTRAVENOUS

## 2013-10-06 MED ORDER — ACETAMINOPHEN 10 MG/ML IV SOLN
1000.0000 mg | INTRAVENOUS | Status: AC
  Administered 2013-10-06: 1000 mg via INTRAVENOUS
  Filled 2013-10-06 (×2): qty 100

## 2013-10-06 MED ORDER — SODIUM CHLORIDE 0.9 % IR SOLN
Status: DC | PRN
  Administered 2013-10-06: 3000 mL

## 2013-10-06 MED ORDER — ACETAMINOPHEN 650 MG RE SUPP: 650.0000 mg | Freq: Four times a day (QID) | RECTAL | Status: DC | PRN

## 2013-10-06 MED ORDER — OXYCODONE-ACETAMINOPHEN 5-325 MG PO TABS
1.0000 | ORAL_TABLET | ORAL | Status: DC | PRN
  Administered 2013-10-06 – 2013-10-07 (×4): 2 via ORAL
  Administered 2013-10-07 (×2): 1 via ORAL
  Filled 2013-10-06 (×3): qty 2
  Filled 2013-10-06 (×2): qty 1
  Filled 2013-10-06: qty 2

## 2013-10-06 MED ORDER — PROPOFOL 10 MG/ML IV BOLUS
INTRAVENOUS | Status: DC | PRN
  Administered 2013-10-06: 20 mg via INTRAVENOUS
  Administered 2013-10-06: 150 mg via INTRAVENOUS
  Administered 2013-10-06: 30 mg via INTRAVENOUS

## 2013-10-06 MED ORDER — GLYCOPYRROLATE 0.2 MG/ML IJ SOLN
INTRAMUSCULAR | Status: AC
  Filled 2013-10-06: qty 1

## 2013-10-06 MED ORDER — SODIUM CHLORIDE 0.9 % IJ SOLN
INTRAMUSCULAR | Status: AC
  Filled 2013-10-06: qty 10

## 2013-10-06 MED ORDER — LIDOCAINE HCL (CARDIAC) 20 MG/ML IV SOLN
INTRAVENOUS | Status: DC | PRN
  Administered 2013-10-06: 100 mg via INTRAVENOUS

## 2013-10-06 MED ORDER — ZOLPIDEM TARTRATE 5 MG PO TABS: 5.0000 mg | ORAL_TABLET | Freq: Every evening | ORAL | Status: DC | PRN

## 2013-10-06 MED ORDER — TRANEXAMIC ACID 100 MG/ML IV SOLN
1000.0000 mg | INTRAVENOUS | Status: AC
  Administered 2013-10-06: 1000 mg via INTRAVENOUS
  Filled 2013-10-06: qty 10

## 2013-10-06 MED ORDER — ONDANSETRON HCL 4 MG/2ML IJ SOLN
INTRAMUSCULAR | Status: AC
  Filled 2013-10-06: qty 2

## 2013-10-06 MED ORDER — BUPIVACAINE LIPOSOME 1.3 % IJ SUSP
20.0000 mL | INTRAMUSCULAR | Status: DC
  Filled 2013-10-06: qty 20

## 2013-10-06 MED ORDER — LEVOTHYROXINE SODIUM 112 MCG PO TABS
112.0000 ug | ORAL_TABLET | Freq: Every day | ORAL | Status: DC
  Administered 2013-10-07: 112 ug via ORAL
  Filled 2013-10-06 (×2): qty 1

## 2013-10-06 MED ORDER — DEXTROSE 5 % IV SOLN
500.0000 mg | Freq: Four times a day (QID) | INTRAVENOUS | Status: DC | PRN
  Filled 2013-10-06: qty 5

## 2013-10-06 MED ORDER — PHENYLEPHRINE HCL 10 MG/ML IJ SOLN
INTRAMUSCULAR | Status: DC | PRN
  Administered 2013-10-06 (×2): 40 ug via INTRAVENOUS
  Administered 2013-10-06 (×2): 80 ug via INTRAVENOUS
  Administered 2013-10-06 (×2): 40 ug via INTRAVENOUS

## 2013-10-06 MED ORDER — KETAMINE HCL 100 MG/ML IJ SOLN
INTRAMUSCULAR | Status: AC
  Filled 2013-10-06: qty 1

## 2013-10-06 SURGICAL SUPPLY — 67 items
APL SKNCLS STERI-STRIP NONHPOA (GAUZE/BANDAGES/DRESSINGS) ×1
BANDAGE ELASTIC 4 VELCRO ST LF (GAUZE/BANDAGES/DRESSINGS) ×2 IMPLANT
BANDAGE ELASTIC 6 VELCRO ST LF (GAUZE/BANDAGES/DRESSINGS) ×2 IMPLANT
BANDAGE ESMARK 6X9 LF (GAUZE/BANDAGES/DRESSINGS) ×1 IMPLANT
BENZOIN TINCTURE PRP APPL 2/3 (GAUZE/BANDAGES/DRESSINGS) ×3 IMPLANT
BLADE SAGITTAL 25.0X1.19X90 (BLADE) ×2 IMPLANT
BLADE SAGITTAL 25.0X1.19X90MM (BLADE) ×1
BLADE SAW SAG 90X13X1.27 (BLADE) ×3 IMPLANT
BNDG CMPR 9X6 STRL LF SNTH (GAUZE/BANDAGES/DRESSINGS) ×1
BNDG ESMARK 6X9 LF (GAUZE/BANDAGES/DRESSINGS) ×3
BOWL SMART MIX CTS (DISPOSABLE) ×3 IMPLANT
CAPT RP KNEE ×2 IMPLANT
CEMENT HV SMART SET (Cement) ×6 IMPLANT
CLOSURE WOUND 1/2 X4 (GAUZE/BANDAGES/DRESSINGS) ×1
COVER SURGICAL LIGHT HANDLE (MISCELLANEOUS) ×5 IMPLANT
CUFF TOURNIQUET SINGLE 34IN LL (TOURNIQUET CUFF) ×3 IMPLANT
CUFF TOURNIQUET SINGLE 44IN (TOURNIQUET CUFF) IMPLANT
DRAPE EXTREMITY T 121X128X90 (DRAPE) ×3 IMPLANT
DRAPE U-SHAPE 47X51 STRL (DRAPES) ×3 IMPLANT
DRSG MEPILEX BORDER 4X12 (GAUZE/BANDAGES/DRESSINGS) ×2 IMPLANT
DRSG PAD ABDOMINAL 8X10 ST (GAUZE/BANDAGES/DRESSINGS) ×3 IMPLANT
DURAPREP 26ML APPLICATOR (WOUND CARE) ×5 IMPLANT
ELECT REM PT RETURN 9FT ADLT (ELECTROSURGICAL) ×3
ELECTRODE REM PT RTRN 9FT ADLT (ELECTROSURGICAL) ×1 IMPLANT
EVACUATOR 1/8 PVC DRAIN (DRAIN) ×3 IMPLANT
FACESHIELD WRAPAROUND (MASK) ×3 IMPLANT
FACESHIELD WRAPAROUND OR TEAM (MASK) ×1 IMPLANT
GAUZE SPONGE 4X4 12PLY STRL (GAUZE/BANDAGES/DRESSINGS) ×3 IMPLANT
GAUZE XEROFORM 5X9 LF (GAUZE/BANDAGES/DRESSINGS) ×1 IMPLANT
GLOVE BIOGEL PI IND STRL 8 (GLOVE) ×2 IMPLANT
GLOVE BIOGEL PI INDICATOR 8 (GLOVE) ×4
GLOVE ECLIPSE 7.5 STRL STRAW (GLOVE) ×6 IMPLANT
GOWN STRL REUS W/ TWL LRG LVL3 (GOWN DISPOSABLE) ×1 IMPLANT
GOWN STRL REUS W/ TWL XL LVL3 (GOWN DISPOSABLE) ×2 IMPLANT
GOWN STRL REUS W/TWL LRG LVL3 (GOWN DISPOSABLE) ×3
GOWN STRL REUS W/TWL XL LVL3 (GOWN DISPOSABLE) ×6
HANDPIECE INTERPULSE COAX TIP (DISPOSABLE) ×3
HOOD PEEL AWAY FACE SHEILD DIS (HOOD) ×9 IMPLANT
IMMOBILIZER KNEE 20 (SOFTGOODS) IMPLANT
IMMOBILIZER KNEE 22 UNIV (SOFTGOODS) ×3 IMPLANT
KIT BASIN OR (CUSTOM PROCEDURE TRAY) ×3 IMPLANT
KIT ROOM TURNOVER OR (KITS) ×3 IMPLANT
MANIFOLD NEPTUNE II (INSTRUMENTS) ×3 IMPLANT
NDL HYPO 25GX1X1/2 BEV (NEEDLE) IMPLANT
NEEDLE 22X1 1/2 (OR ONLY) (NEEDLE) ×2 IMPLANT
NEEDLE HYPO 25GX1X1/2 BEV (NEEDLE) IMPLANT
NS IRRIG 1000ML POUR BTL (IV SOLUTION) ×3 IMPLANT
PACK TOTAL JOINT (CUSTOM PROCEDURE TRAY) ×3 IMPLANT
PAD ARMBOARD 7.5X6 YLW CONV (MISCELLANEOUS) ×6 IMPLANT
PAD CAST 4YDX4 CTTN HI CHSV (CAST SUPPLIES) ×1 IMPLANT
PADDING CAST COTTON 4X4 STRL (CAST SUPPLIES) ×3
PADDING CAST COTTON 6X4 STRL (CAST SUPPLIES) ×2 IMPLANT
SET HNDPC FAN SPRY TIP SCT (DISPOSABLE) ×1 IMPLANT
STAPLER VISISTAT 35W (STAPLE) IMPLANT
STRIP CLOSURE SKIN 1/2X4 (GAUZE/BANDAGES/DRESSINGS) ×2 IMPLANT
SUCTION FRAZIER TIP 10 FR DISP (SUCTIONS) ×3 IMPLANT
SUT MNCRL AB 3-0 PS2 18 (SUTURE) ×2 IMPLANT
SUT VIC AB 0 CTB1 27 (SUTURE) ×6 IMPLANT
SUT VIC AB 1 CT1 27 (SUTURE) ×6
SUT VIC AB 1 CT1 27XBRD ANBCTR (SUTURE) ×2 IMPLANT
SUT VIC AB 2-0 CTB1 (SUTURE) ×6 IMPLANT
SYR 50ML LL SCALE MARK (SYRINGE) ×3 IMPLANT
SYR CONTROL 10ML LL (SYRINGE) IMPLANT
TOWEL OR 17X24 6PK STRL BLUE (TOWEL DISPOSABLE) ×3 IMPLANT
TOWEL OR 17X26 10 PK STRL BLUE (TOWEL DISPOSABLE) ×3 IMPLANT
TRAY FOLEY CATH 16FRSI W/METER (SET/KITS/TRAYS/PACK) ×3 IMPLANT
WATER STERILE IRR 1000ML POUR (IV SOLUTION) ×6 IMPLANT

## 2013-10-06 NOTE — Transfer of Care (Signed)
Immediate Anesthesia Transfer of Care Note  Patient: [name] Hughes  Procedure(s) Performed: Procedure(s): LEFT TOTAL KNEE ARTHROPLASTY (Left)  Patient Location: PACU  Anesthesia Type:General  Level of Consciousness: awake, alert , oriented and patient cooperative  Airway & Oxygen Therapy: Patient Spontanous Breathing and Patient connected to face mask oxygen  Post-op Assessment: Report given to PACU RN and Post -op Vital signs reviewed and stable  Post vital signs: Reviewed  Complications: No apparent anesthesia complications

## 2013-10-06 NOTE — Care Management Note (Signed)
CARE MANAGEMENT NOTE 10/06/2013  Patient:  OLESYA, WIKE   Account Number:  [id number]  Date Initiated:  10/06/2013  Documentation initiated by:  Vance Peper  Subjective/Objective Assessment:   48 yr old female s/p left total knee arthroplasty.     Action/Plan:   Case manager spoke with patient concerning home health and DME needs at discharge. Patient states she is setup with [facility]. CM will confirm   Anticipated DC Date:  10/08/2013   Anticipated DC Plan:  HOME W HOME HEALTH SERVICES      DC Planning Services  CM consult      [facility] Choice  HOME HEALTH  DURABLE MEDICAL EQUIPMENT   Choice offered to / List presented to:  C-1 Patient   DME arranged  [facility]  3-N-1  [facility]      DME agency  [employer]     HH arranged  HH-2 PT      [facility] agency  [facility]   Status of service:  In process, will continue to follow Medicare Important Message given?   (If response is "NO", the following Medicare IM given date fields will be blank) Date Medicare IM given:   Medicare IM given by:   Date Additional Medicare IM given:   Additional Medicare IM given by:    Discharge Disposition:  HOME W HOME HEALTH SERVICES  Per UR Regulation:  Reviewed for med. necessity/level of care/duration of stay  If discussed at Long Length of Stay Meetings, dates discussed:

## 2013-10-06 NOTE — Plan of Care (Signed)
Problem: Consults Goal: Diagnosis- Total Joint Replacement Primary Total Knee Left     

## 2013-10-06 NOTE — Progress Notes (Signed)
Utilization review completed.  

## 2013-10-06 NOTE — Progress Notes (Signed)
Orthopedic Tech Progress Note Patient Details:  [name] Hughes Mar 19, 1965 [id number] CPM applied to LLE with appropriate settings. OHF applied to bed CPM Left Knee CPM Left Knee: On Left Knee Flexion (Degrees): 90 Left Knee Extension (Degrees): 0 Additional Comments: applied at 100   [name] Hughes 10/06/2013, 11:51 AM

## 2013-10-06 NOTE — Anesthesia Postprocedure Evaluation (Signed)
  Anesthesia Post-op Note  Patient: [name] Hughes  Procedure(s) Performed: Procedure(s): LEFT TOTAL KNEE ARTHROPLASTY (Left)  Patient Location: PACU  Anesthesia Type:General  Level of Consciousness: awake and alert   Airway and Oxygen Therapy: Patient Spontanous Breathing and Patient connected to nasal cannula oxygen  Post-op Pain: none  Post-op Assessment: Post-op Vital signs reviewed, Patient's Cardiovascular Status Stable and Respiratory Function Stable  Post-op Vital Signs: Reviewed and stable  Last Vitals:  Filed Vitals:   10/06/13 1015  BP: 111/64  Pulse: 58  Temp:   Resp: 9    Complications: No apparent anesthesia complications

## 2013-10-06 NOTE — Evaluation (Signed)
Physical Therapy Evaluation Patient Details Name: [name] Hughes MRN: [id number] DOB: 1965/08/13 Today's Date: 10/06/2013   History of Present Illness  Pt admitted for L TKA  Clinical Impression  Pt very pleasant and moving well. Pt educated for HEP, transfers, gait, functional mobility and DME use. Pt will benefit from continued therapy to maximize ROM, strength, gait and transfers to return pt to independent function.     Follow Up Recommendations Home health PT    Equipment Recommendations  Rolling walker with 5" wheels;3in1 (PT)    Recommendations for Other Services       Precautions / Restrictions Precautions Precautions: Fall;Knee Required Braces or Orthoses: Knee Immobilizer - Left Knee Immobilizer - Left: On when out of bed or walking;Discontinue once straight leg raise with < 10 degree lag Restrictions Weight Bearing Restrictions: Yes LLE Weight Bearing: Weight bearing as tolerated      Mobility  Bed Mobility Overal bed mobility: Needs Assistance Bed Mobility: Supine to Sit     Supine to sit: Min assist     General bed mobility comments: cueing and assist for LLE   Transfers Overall transfer level: Needs assistance   Transfers: Sit to/from Stand Sit to Stand: Min guard         General transfer comment: cues for hand placement, foot position and anterior translation  Ambulation/Gait Ambulation/Gait assistance: Min guard Ambulation Distance (Feet): 20 Feet Assistive device: Rolling walker (2 wheeled) Gait Pattern/deviations: Step-to pattern   Gait velocity interpretation: Below normal speed for age/gender    Stairs            Wheelchair Mobility    Modified Rankin (Stroke Patients Only)       Balance                                             Pertinent Vitals/Pain Pain Assessment: No/denies pain    Home Living Family/patient expects to be discharged to:: Private residence Living Arrangements:  Spouse/significant other Available Help at Discharge: Family;Available 24 hours/day Type of Home: House Home Access: Level entry     Home Layout: One level Home Equipment: None      Prior Function Level of Independence: Independent               Hand Dominance        Extremity/Trunk Assessment   Upper Extremity Assessment: Overall WFL for tasks assessed           Lower Extremity Assessment: LLE deficits/detail   LLE Deficits / Details: limited by expected post op tightness  Cervical / Trunk Assessment: Normal  Communication   Communication: No difficulties  Cognition Arousal/Alertness: Awake/alert Behavior During Therapy: WFL for tasks assessed/performed Overall Cognitive Status: Within Functional Limits for tasks assessed                      General Comments      Exercises Total Joint Exercises Ankle Circles/Pumps: AROM;Both;10 reps;Supine Quad Sets: AROM;Left;5 reps;Supine Heel Slides: AAROM;Left;5 reps;Supine      Assessment/Plan    PT Assessment Patient needs continued PT services  PT Diagnosis Difficulty walking;Generalized weakness   PT Problem List Decreased strength;Decreased range of motion;Decreased activity tolerance;Decreased safety awareness;Decreased mobility;Decreased knowledge of use of DME  PT Treatment Interventions DME instruction;Gait training;Functional mobility training;Therapeutic activities;Therapeutic exercise;Patient/family education   PT Goals (Current goals can be found in  the Care Plan section) Acute Rehab PT Goals Patient Stated Goal: be able to go back to work PT Goal Formulation: With patient/family Time For Goal Achievement: 10/20/13 Potential to Achieve Goals: Good    Frequency 7X/week   Barriers to discharge        Co-evaluation               End of Session Equipment Utilized During Treatment: Gait belt Activity Tolerance: Patient tolerated treatment well Patient left: in chair;with call  bell/phone within reach;with family/visitor present Nurse Communication: Mobility status;Weight bearing status         Time: 1313-1346 PT Time Calculation (min): 33 min   Charges:   PT Evaluation $Initial PT Evaluation Tier I: 1 Procedure PT Treatments $Gait Training: 8-22 mins $Therapeutic Activity: 8-22 mins   PT G CodesDelorse Lek 10/06/2013, 2:06 PM Delaney Meigs, PT [phone]

## 2013-10-06 NOTE — Discharge Instructions (Signed)
Total Knee Replacement, Care After °Refer to this sheet in the next few weeks. These instructions provide you with information on caring for yourself after your procedure. Your health care provider also may give you specific instructions. Your treatment has been planned according to the most current medical practices, but problems sometimes occur. Call your health care provider if you have any problems or questions after your procedure. °HOME CARE INSTRUCTIONS  °· See a physical therapist as directed by your health care provider. °· Take medicines only as directed by your health care provider. °· Avoid lifting or driving until you are instructed otherwise. °· If you have been sent home with a continuous passive motion machine, use it as directed by your health care provider. °SEEK MEDICAL CARE IF: °· You have difficulty breathing. °· You have drainage, redness, swelling, or pain at your incision site. °· You have a bad smell coming from your incision site. °· You have persistent bleeding from your incision site. °· Your incision breaks open after sutures (stitches) or staples have been removed. °· You have a fever. °SEEK IMMEDIATE MEDICAL CARE IF:  °· You have a rash. °· You have pain or swelling in your calf or thigh. °· You have shortness of breath or chest pain. °· Your range of motion in your knee is decreasing rather than increasing. °MAKE SURE YOU:  °· Understand these instructions. °· Will watch your condition. °· Will get help right away if you are not doing well or get worse. °Document Released: 08/08/2004 Document Revised: 06/05/2013 Document Reviewed: 03/10/2011 °ExitCare® Patient Information ©2015 [employer]. This information is not intended to replace advice given to you by your health care provider. Make sure you discuss any questions you have with your health care provider. ° °

## 2013-10-06 NOTE — Brief Op Note (Signed)
10/06/2013  9:23 AM  PATIENT:  [name] Hughes  48 y.o. female  PRE-OPERATIVE DIAGNOSIS:  SEVERE DEGENERATIVE JOINT DISEASE  POST-OPERATIVE DIAGNOSIS:  SEVERE DEGENERATIVE JOINT DISEASE  PROCEDURE:  Procedure(s): LEFT TOTAL KNEE ARTHROPLASTY (Left)  SURGEON:  Surgeon(s) and Role:    * Harvie Junior, MD - Primary  PHYSICIAN ASSISTANT:   ASSISTANTS: bethune   ANESTHESIA:   general  EBL:  Total I/O In: 1000 [I.V.:1000] Out: -   BLOOD ADMINISTERED:none  DRAINS: (1) Hemovact drain(s) in the l knee with  Suction Open   LOCAL MEDICATIONS USED:  OTHER experel  SPECIMEN:  No Specimen  DISPOSITION OF SPECIMEN:  N/A  COUNTS:  YES  TOURNIQUET:   Total Tourniquet Time Documented: Thigh (Left) - 64 minutes Total: Thigh (Left) - 64 minutes   DICTATION: .Other Dictation: Dictation Number [id number]  PLAN OF CARE: Admit to inpatient   PATIENT DISPOSITION:  PACU - hemodynamically stable.   Delay start of Pharmacological VTE agent (>24hrs) due to surgical blood loss or risk of bleeding: no

## 2013-10-06 NOTE — Anesthesia Postprocedure Evaluation (Signed)
  Anesthesia Post-op Note  Patient: [name] Hughes  Procedure(s) Performed: Procedure(s): LEFT TOTAL KNEE ARTHROPLASTY (Left)  Patient Location: PACU  Anesthesia Type:General  Level of Consciousness: awake, alert  and oriented  Airway and Oxygen Therapy: Patient Spontanous Breathing and Patient connected to nasal cannula oxygen  Post-op Pain: mild  Post-op Assessment: Post-op Vital signs reviewed, Patient's Cardiovascular Status Stable and Respiratory Function Stable  Post-op Vital Signs: Reviewed and stable  Last Vitals:  Filed Vitals:   10/06/13 1423  BP: 128/50  Pulse: 64  Temp: 36.6 C  Resp: 16    Complications: No apparent anesthesia complications

## 2013-10-06 NOTE — Op Note (Signed)
[name], WEARING NO.:  [id number]  MEDICAL RECORD NO.:  [id number]  LOCATION:  [facility]                         FACILITY:  [facility]  PHYSICIAN:  [name] Hughes, M.D.   DATE OF BIRTH:  06/27/1965  DATE OF PROCEDURE:  10/06/2013 DATE OF DISCHARGE:                              OPERATIVE REPORT   PREOPERATIVE DIAGNOSIS:  End-stage degenerative joint disease bilateral knee.  POSTOPERATIVE DIAGNOSIS:  End-stage degenerative joint disease bilateral knee.  PROCEDURE:  Left total knee replacement with Sigma system size 3 femur, size 3 tibia, 15-mm bridging bearing, and a 35-mm all-polyethylene patella.  SURGEON:  [name] Junior, MD  ASSISTANT:  Marshia Ly, PA  ANESTHESIA:  General.  BRIEF HISTORY:  [name] Hughes is a 48 year old female with a long history of essential complaints of bilateral knee pain.  She had bilateral knee arthroscopy.  She had x-ray showing bone-on-bone change.  She has night pain, light activity pain, and has failed all conservative care including activity modification, weight loss, use of a cane, physical therapy, and injection therapy.  She was brought to the operating room for total knee replacement.  DESCRIPTION OF PROCEDURE:  The patient was brought to the operating room.  After adequate anesthesia was achieved with general anesthetic, the patient was placed supine on the operating table.  Left leg was prepped and draped in usual sterile fashion.  Following this, the leg was exsanguinated.  Blood pressure tourniquet was inflated to 350 mmHg. Following this, a midline incision was made in the subcutaneous tissue down the level of the extensor mechanism and a medial parapatellar arthrotomy was undertaken.  Once that was completed, attention was turned to medial and lateral meniscus.  We removed the retropatellar fat pad, synovium in the anterior aspect of the femur, and the anterior and posterior cruciates.  Once this was done, the knee  was exposed and the tibia was cut perpendicular to its long axis with an extramedullary guide.  Attention then turned to the femur where an intramedullary pilot hole was drilled and with a 5-degree valgus inclination the distal femur was cut, over 10 mm was cut off.  At this point, a spacer block was put in place.  A 12.5 spacer block went in nicely in full extension.  At this point, attention was turned to the femur, which was sized to a 3. Anterior and posterior cuts were made, chamfers and box.  Attention was turned to the tibia, sized to a 3, drilled and keeled.  Once this was completed, the attention was turned to putting in trial components.  A trial 3 was used on the tibia, trial 3 on the femur, a 12.5 mm bridging bearing was placed and attention was turned to the patella.  Patella was extremely hard, cut down to a level of 13 mm and a 35 paddle was chosen and lugs were drilled.  Once this was completed, the knee was put through a range of motion, excellent stability, range of motion were achieved, and at this time attention was turned back to the knee where the trial components were removed.  The knee was then copiously and thoroughly lavaged, suctioned dry.  The  final components were then cemented into place.  Size 3 femur, size 3 tibia, 12.5 mm bridging bearing, and a 35-mm all poly patella was placed, and a clamp was used to hold this.  All of the cement was allowed to harden.  All excess bone cement was removed, and a medium Hemovac drain was placed, and the cement was allowed to harden.  Once it was completely hardened and all excess bone cement were removed, the tourniquet was let down.  We trialed a 15 poly, it was tight but I thought a little bit better with the flexion gap so I went ahead with the 15 poly, and once this was completed, the final poly was placed.  A medium Hemovac drain was placed.  The medial parapatellar arthrotomy was closed with 1 Vicryl running, skin  was closed with 0 and 2-0 Vicryl and 3-0 Monocryl subcuticular.  Benzoin and Steri-Strips were applied.  Sterile compressive dressing was applied.  The patient was taken to the recovery room where she was noted to be in satisfactory condition.  Once the cement was allowed to harden, tourniquet was let down.  All bleeding was controlled with electrocautery.  A 60 mL of Exparel was instilled into and around the tissues of the knee for postoperative pain control.  The patient at this point was taken to recovery room where she was noted to be in satisfactory condition.  Estimated blood loss for the procedure was minimal.     [name] Hughes, M.D.     Ranae Plumber  D:  10/06/2013  T:  10/06/2013  Job:  [id number]

## 2013-10-06 NOTE — Anesthesia Procedure Notes (Addendum)
Anesthesia Regional Block:  Femoral nerve block  Pre-Anesthetic Checklist: ,, timeout performed,, Correct Site,, Correct Procedure,, site marked,,, surgical consent,, at surgeon's request  Laterality: Left  Prep: Maximum Sterile Barrier Precautions used and chloraprep       Needles:   Needle Type: Echogenic Stimulator Needle     Needle Length: 9cm 9 cm Needle Gauge: 21 and 21 G    Additional Needles:  Procedures: ultrasound guided (picture in chart) and nerve stimulator Femoral nerve block  Nerve Stimulator or Paresthesia:  Response: 0.4 mA,   Additional Responses:   Narrative:  Start time: 10/06/2013 7:12 AM End time: 10/06/2013 7:21 AM  Performed by: Personally  Anesthesiologist: Berneice Heinrich   Procedure Name: Intubation Date/Time: 10/06/2013 7:47 AM Performed by: Romie Minus K Pre-anesthesia Checklist: Patient identified, Emergency Drugs available, Suction available, Patient being monitored and Timeout performed Patient Re-evaluated:Patient Re-evaluated prior to inductionOxygen Delivery Method: Circle system utilized Preoxygenation: Pre-oxygenation with 100% oxygen Intubation Type: IV induction Ventilation: Mask ventilation without difficulty Laryngoscope Size: Miller and 2 Grade View: Grade I Tube type: Oral Tube size: 7.0 mm Number of attempts: 1 Airway Equipment and Method: Stylet Placement Confirmation: ETT inserted through vocal cords under direct vision,  positive ETCO2,  CO2 detector and breath sounds checked- equal and bilateral Secured at: 21 cm Tube secured with: Tape Dental Injury: Teeth and Oropharynx as per pre-operative assessment    Left femoral nerve block, US guided and stimulatior to .4.  30ml .5% marcaine with epi injectred slowly after multiple negative aspirations.  Pt. Talking throughout procedure.  Sedated with fentanyl and  versed.  No complications.

## 2013-10-06 NOTE — H&P (Signed)
TOTAL KNEE ADMISSION H&P  Patient is being admitted for left total knee arthroplasty.  Subjective:  Chief Complaint:left knee pain.  HPI: [name] Hughes, 48 y.o. female, has a history of pain and functional disability in the left knee due to arthritis and has failed non-surgical conservative treatments for greater than 12 weeks to includeNSAID's and/or analgesics, corticosteriod injections, viscosupplementation injections, flexibility and strengthening excercises, weight reduction as appropriate and activity modification.  Onset of symptoms was gradual, starting 5 years ago with gradually worsening course since that time. The patient noted prior procedures on the knee to include  arthroscopy and menisectomy on the bilaterally knee(s).  Patient currently rates pain in the left knee(s) at 8 out of 10 with activity. Patient has night pain, worsening of pain with activity and weight bearing, pain that interferes with activities of daily living, pain with passive range of motion, crepitus and joint swelling.  Patient has evidence of subchondral cysts, subchondral sclerosis and joint space narrowing by imaging studies. This patient has had failure of all reasonable conservative care. There is no active infection.  Patient Active Problem List   Diagnosis Date Noted  . Essential hypertension 07/21/2013  . Chest pain 07/21/2013   Past Medical History  Diagnosis Date  . HTN (hypertension)     takes Diovan HCT daily  . Hypothyroid     takes Synthroid daily  . Headache(784.0)   . Arthritis   . Joint pain   . Joint swelling   . Chronic back pain     reason unknown but thinks related to knees    Past Surgical History  Procedure Laterality Date  . Abdominal hysterectomy    . Gallbladder surgery    . Knee arthroscopy Bilateral   . Cesarean section      Prescriptions prior to admission  Medication Sig Dispense Refill  . HYDROcodone-acetaminophen (NORCO/VICODIN) 5-325 MG per tablet Take 1 tablet by  mouth every 6 (six) hours as needed for moderate pain.      Marland Kitchen ibuprofen (ADVIL,MOTRIN) 200 MG tablet Take 200-800 mg by mouth every 6 (six) hours as needed for moderate pain.      Marland Kitchen SYNTHROID 112 MCG tablet Take 112 mcg by mouth daily before breakfast.       . valsartan-hydrochlorothiazide (DIOVAN-HCT) 320-25 MG per tablet Take 1 tablet by mouth daily.        No Known Allergies  History  Substance Use Topics  . Smoking status: Current Every Day Smoker -- 0.50 packs/day for 10 years  . Smokeless tobacco: Not on file  . Alcohol Use: Yes     Comment: occasionally    Family History  Problem Relation Age of Onset  . Hyperlipidemia Father   . Heart attack Brother   . Stroke Paternal Grandmother      ROS ROS: I have reviewed the patient's review of systems thoroughly and there are no positive responses as relates to the HPI. Objective:  Physical Exam .normal Vital signs in last 24 hours: Temp:  [97.8 F (36.6 C)] 97.8 F (36.6 C) (09/04 0603) Pulse Rate:  [56] 56 (09/04 0603) Resp:  [20] 20 (09/04 0603) BP: (106)/(46) 106/46 mmHg (09/04 0603) SpO2:  [99 %] 99 % (09/04 0603) Weight:  [279 lb 3.2 oz (126.644 kg)] 279 lb 3.2 oz (126.644 kg) (09/04 0603) Well-developed well-nourished patient in no acute distress. Alert and oriented x3 HEENT:within normal limits Cardiac: Regular rate and rhythm Pulmonary: Lungs clear to auscultation Abdomen: Soft and nontender.  Normal active  bowel sounds  Musculoskeletal: left knee: Painful range of motion.  No instability.  Range of motion 0-95.  Mild varus malalignment.  2+ distal pulses and normal sensation Labs: Recent Results (from the past 2160 hour(s))  SURGICAL PCR SCREEN     Status: None   Collection Time    09/28/13  3:50 PM      Result Value Ref Range   MRSA, PCR NEGATIVE  NEGATIVE   Staphylococcus aureus NEGATIVE  NEGATIVE   Comment:            The Xpert SA Assay (FDA     approved for NASAL specimens     in patients over 46  years of age),     is one component of     a comprehensive surveillance     program.  Test performance has     been validated by [employer] for patients greater     than or equal to 58 year old.     It is not intended     to diagnose infection nor to     guide or monitor treatment.  APTT     Status: None   Collection Time    09/28/13  3:50 PM      Result Value Ref Range   aPTT 33  24 - 37 seconds  CBC WITH DIFFERENTIAL     Status: Abnormal   Collection Time    09/28/13  3:50 PM      Result Value Ref Range   WBC 7.6  4.0 - 10.5 K/uL   RBC 4.14  3.87 - 5.11 MIL/uL   Hemoglobin 11.8 (*) 12.0 - 15.0 g/dL   HCT 35.4 (*) 36.0 - 46.0 %   MCV 85.5  78.0 - 100.0 fL   MCH 28.5  26.0 - 34.0 pg   MCHC 33.3  30.0 - 36.0 g/dL   RDW 13.2  11.5 - 15.5 %   Platelets 209  150 - 400 K/uL   Neutrophils Relative % 55  43 - 77 %   Neutro Abs 4.2  1.7 - 7.7 K/uL   Lymphocytes Relative 34  12 - 46 %   Lymphs Abs 2.6  0.7 - 4.0 K/uL   Monocytes Relative 8  3 - 12 %   Monocytes Absolute 0.6  0.1 - 1.0 K/uL   Eosinophils Relative 3  0 - 5 %   Eosinophils Absolute 0.2  0.0 - 0.7 K/uL   Basophils Relative 0  0 - 1 %   Basophils Absolute 0.0  0.0 - 0.1 K/uL  COMPREHENSIVE METABOLIC PANEL     Status: Abnormal   Collection Time    09/28/13  3:50 PM      Result Value Ref Range   Sodium 137  137 - 147 mEq/L   Potassium 3.8  3.7 - 5.3 mEq/L   Chloride 98  96 - 112 mEq/L   CO2 28  19 - 32 mEq/L   Glucose, Bld 89  70 - 99 mg/dL   BUN 11  6 - 23 mg/dL   Creatinine, Ser 0.86  0.50 - 1.10 mg/dL   Calcium 9.3  8.4 - 10.5 mg/dL   Total Protein 7.3  6.0 - 8.3 g/dL   Albumin 3.0 (*) 3.5 - 5.2 g/dL   AST 16  0 - 37 U/L   ALT 12  0 - 35 U/L   Alkaline Phosphatase 98  39 - 117 U/L   Total Bilirubin  0.3  0.3 - 1.2 mg/dL   GFR calc non Af Amer 79 (*) >90 mL/min   GFR calc Af Amer >90  >90 mL/min   Comment: (NOTE)     The eGFR has been calculated using the CKD EPI equation.     This calculation has  not been validated in all clinical situations.     eGFR's persistently <90 mL/min signify possible Chronic Kidney     Disease.   Anion gap 11  5 - 15  PROTIME-INR     Status: None   Collection Time    09/28/13  3:50 PM      Result Value Ref Range   Prothrombin Time 14.0  11.6 - 15.2 seconds   INR 1.08  0.00 - 1.49  URINALYSIS, ROUTINE W REFLEX MICROSCOPIC     Status: None   Collection Time    09/28/13  3:50 PM      Result Value Ref Range   Color, Urine YELLOW  YELLOW   APPearance CLEAR  CLEAR   Specific Gravity, Urine 1.021  1.005 - 1.030   pH 6.0  5.0 - 8.0   Glucose, UA NEGATIVE  NEGATIVE mg/dL   Hgb urine dipstick NEGATIVE  NEGATIVE   Bilirubin Urine NEGATIVE  NEGATIVE   Ketones, ur NEGATIVE  NEGATIVE mg/dL   Protein, ur NEGATIVE  NEGATIVE mg/dL   Urobilinogen, UA 0.2  0.0 - 1.0 mg/dL   Nitrite NEGATIVE  NEGATIVE   Leukocytes, UA NEGATIVE  NEGATIVE   Comment: MICROSCOPIC NOT DONE ON URINES WITH NEGATIVE PROTEIN, BLOOD, LEUKOCYTES, NITRITE, OR GLUCOSE <1000 mg/dL.  TYPE AND SCREEN     Status: None   Collection Time    09/28/13  3:53 PM      Result Value Ref Range   ABO/RH(D) O POS     Antibody Screen NEG     Sample Expiration 10/12/2013    ABO/RH     Status: None   Collection Time    09/28/13  3:53 PM      Result Value Ref Range   ABO/RH(D) O POS      Estimated body mass index is 46.46 kg/(m^2) as calculated from the following:   Height as of this encounter: 5' 5" (1.651 m).   Weight as of this encounter: 279 lb 3.2 oz (126.644 kg).   Imaging Review Plain radiographs demonstrate severe degenerative joint disease of the bilaterally knee(s). The overall alignment ismild varus. The bone quality appears to be good for age and reported activity level.  Assessment/Plan:  End stage arthritis, left knee   The patient history, physical examination, clinical judgment of the provider and imaging studies are consistent with end stage degenerative joint disease of the left  knee(s) and total knee arthroplasty is deemed medically necessary. The treatment options including medical management, injection therapy arthroscopy and arthroplasty were discussed at length. The risks and benefits of total knee arthroplasty were presented and reviewed. The risks due to aseptic loosening, infection, stiffness, patella tracking problems, thromboembolic complications and other imponderables were discussed. The patient acknowledged the explanation, agreed to proceed with the plan and consent was signed. Patient is being admitted for inpatient treatment for surgery, pain control, PT, OT, prophylactic antibiotics, VTE prophylaxis, progressive ambulation and ADL's and discharge planning. The patient is planning to be discharged home with home health services

## 2013-10-07 LAB — BASIC METABOLIC PANEL
Anion gap: 10 (ref 5–15)
BUN: 17 mg/dL (ref 6–23)
CALCIUM: 8.2 mg/dL — AB (ref 8.4–10.5)
CO2: 26 mEq/L (ref 19–32)
CREATININE: 1.11 mg/dL — AB (ref 0.50–1.10)
Chloride: 100 mEq/L (ref 96–112)
GFR calc non Af Amer: 58 mL/min — ABNORMAL LOW (ref >90)
GFR, EST AFRICAN AMERICAN: 67 mL/min — AB (ref >90)
Glucose, Bld: 128 mg/dL — ABNORMAL HIGH (ref 70–99)
POTASSIUM: 3.8 meq/L (ref 3.7–5.3)
Sodium: 136 mEq/L — ABNORMAL LOW (ref 137–147)

## 2013-10-07 LAB — CBC
HCT: 30.4 % — ABNORMAL LOW (ref 36.0–46.0)
Hemoglobin: 10.1 g/dL — ABNORMAL LOW (ref 12.0–15.0)
MCH: 28.8 pg (ref 26.0–34.0)
MCHC: 33.2 g/dL (ref 30.0–36.0)
MCV: 86.6 fL (ref 78.0–100.0)
Platelets: 197 10*3/uL (ref 150–400)
RBC: 3.51 MIL/uL — ABNORMAL LOW (ref 3.87–5.11)
RDW: 13.2 % (ref 11.5–15.5)
WBC: 14.8 10*3/uL — ABNORMAL HIGH (ref 4.0–10.5)

## 2013-10-07 MED ORDER — ASPIRIN EC 325 MG PO TBEC: 325.0000 mg | DELAYED_RELEASE_TABLET | Freq: Two times a day (BID) | ORAL | Status: DC

## 2013-10-07 NOTE — Progress Notes (Addendum)
Subjective: 1 Day Post-Op Procedure(s) (LRB): LEFT TOTAL KNEE ARTHROPLASTY (Left) Patient reports pain as moderate.  Taking by mouth and voiding okay.  Objective: Vital signs in last 24 hours: Temp:  [97.1 F (36.2 C)-98.1 F (36.7 C)] 97.9 F (36.6 C) (09/05 0555) Pulse Rate:  [57-69] 69 (09/05 0555) Resp:  [8-18] 16 (09/05 0555) BP: (101-128)/(34-91) 104/42 mmHg (09/05 0555) SpO2:  [95 %-100 %] 100 % (09/05 0555)  Intake/Output from previous day: 09/04 0701 - 09/05 0700 In: 2000 [I.V.:2000] Out: 375 [Drains:375] Intake/Output this shift: Total I/O In: 150 [I.V.:150] Out: -    Recent Labs  10/07/13 0416  HGB 10.1*    Recent Labs  10/07/13 0416  WBC 14.8*  RBC 3.51*  HCT 30.4*  PLT 197    Recent Labs  10/07/13 0416  NA 136*  K 3.8  CL 100  CO2 26  BUN 17  CREATININE 1.11*  GLUCOSE 128*  CALCIUM 8.2*    left knee exam Neurovascular intact Sensation intact distally Intact pulses distally Dorsiflexion/Plantar flexion intact Incision: dressing C/D/I Compartment soft  Assessment/Plan: 1 Day Post-Op Procedure(s) (LRB): LEFT TOTAL KNEE ARTHROPLASTY (Left) Plan: Hemovac drain pulled. Continue aspirin 325 mg twice daily with SCDs for DVT prophylaxis. If does well with physical therapy and pain is managed can discharge later today. Left knee dressing will be changed before discharge.  Jader Desai G 10/07/2013, 8:49 AM

## 2013-10-07 NOTE — Discharge Summary (Signed)
Patient ID: [name] Hughes MRN: [id number] DOB/AGE: 1965/07/07 48 y.o.  Admit date: 10/06/2013 Discharge date: 10/07/2013  Admission Diagnoses:  Principal Problem:   Osteoarthritis of left knee Active Problems:   Severe obesity (BMI >= 40)   Discharge Diagnoses:  Same  Past Medical History  Diagnosis Date  . HTN (hypertension)     takes Diovan HCT daily  . Hypothyroid     takes Synthroid daily  . Headache(784.0)   . Arthritis   . Joint pain   . Joint swelling   . Chronic back pain     reason unknown but thinks related to knees    Surgeries: Procedure(s): LEFT TOTAL KNEE ARTHROPLASTY on 10/06/2013   Discharged Condition: Improved  Hospital Course: [name] Hughes is an 48 y.o. female who was admitted 10/06/2013 for operative treatment ofOsteoarthritis of left knee. Patient has severe unremitting pain that affects sleep, daily activities, and work/hobbies. After pre-op clearance the patient was taken to the operating room on 10/06/2013 and underwent  Procedure(s): LEFT TOTAL KNEE ARTHROPLASTY.    Patient was given perioperative antibiotics: Anti-infectives   Start     Dose/Rate Route Frequency Ordered Stop   10/06/13 1400  ceFAZolin (ANCEF) IVPB 2 g/50 mL premix     2 g 100 mL/hr over 30 Minutes Intravenous Every 6 hours 10/06/13 1118 10/06/13 2058   10/06/13 0600  ceFAZolin (ANCEF) 3 g in dextrose 5 % 50 mL IVPB     3 g 160 mL/hr over 30 Minutes Intravenous On call to O.R. 10/05/13 1408 10/06/13 0740       Patient was given sequential compression devices, early ambulation, and chemoprophylaxis to prevent DVT.the patient's dressing was changed prior to discharge.  Her wound was entirely benign.  Patient benefited maximally from hospital stay and there were no complications.    Recent vital signs: Patient Vitals for the past 24 hrs:  BP Temp Temp src Pulse Resp SpO2  10/07/13 1310 98/34 mmHg 97.5 F (36.4 C) Oral 63 18 100 %  10/07/13 1045 94/46 mmHg - - 61 - -  10/07/13  0555 104/42 mmHg 97.9 F (36.6 C) - 69 16 100 %  10/07/13 0035 101/34 mmHg 98.1 F (36.7 C) - 58 16 96 %  10/06/13 2003 114/91 mmHg 98 F (36.7 C) - 58 16 95 %     Recent laboratory studies:  Recent Labs  10/07/13 0416  WBC 14.8*  HGB 10.1*  HCT 30.4*  PLT 197  NA 136*  K 3.8  CL 100  CO2 26  BUN 17  CREATININE 1.11*  GLUCOSE 128*  CALCIUM 8.2*     Discharge Medications:     Medication List    STOP taking these medications       HYDROcodone-acetaminophen 5-325 MG per tablet  Commonly known as:  NORCO/VICODIN     ibuprofen 200 MG tablet  Commonly known as:  ADVIL,MOTRIN      TAKE these medications       aspirin EC 325 MG tablet  Take 1 tablet (325 mg total) by mouth 2 (two) times daily after a meal. Take until 1 month post op to decrease risk of blood clots.     methocarbamol 750 MG tablet  Commonly known as:  ROBAXIN-750  Take 1 tablet (750 mg total) by mouth every 8 (eight) hours as needed for muscle spasms.     oxyCODONE-acetaminophen 5-325 MG per tablet  Commonly known as:  PERCOCET/ROXICET  Take 1-2 tablets by mouth every 6 (  six) hours as needed for severe pain.     SYNTHROID 112 MCG tablet  Generic drug:  levothyroxine  Take 112 mcg by mouth daily before breakfast.     valsartan-hydrochlorothiazide 320-25 MG per tablet  Commonly known as:  DIOVAN-HCT  Take 1 tablet by mouth daily.        Diagnostic Studies: Dg Chest 2 View  09/28/2013   CLINICAL DATA:  Preop for left total knee arthroplasty. Hypertension.  EXAM: CHEST  2 VIEW  COMPARISON:  None.  FINDINGS: Midline trachea.  Normal heart size and mediastinal contours.  Sharp costophrenic angles.  No pneumothorax.  Clear lungs.  Moderate thoracic spondylosis.  IMPRESSION: No acute cardiopulmonary disease.   Electronically Signed   By: Jeronimo Greaves M.D.   On: 09/28/2013 15:56    Disposition: home with home health physical therapy.      Discharge Instructions   CPM    Complete by:  As directed    Continuous passive motion machine (CPM):      Use the CPM from , 0 to 70 for 8 hours per day.      You may increase by 5 per day.  You may break it up into 2 or 3 sessions per day.      Use CPM for 1 to 2 weeks or until you are told to stop.     Call MD / Call 911    Complete by:  As directed   If you experience chest pain or shortness of breath, CALL 911 and be transported to the hospital emergency room.  If you develope a fever above 101 F, pus (white drainage) or increased drainage or redness at the wound, or calf pain, call your surgeon's office.     Constipation Prevention    Complete by:  As directed   Drink plenty of fluids.  Prune juice may be helpful.  You may use a stool softener, such as Colace (over the counter) 100 mg twice a day.  Use MiraLax (over the counter) for constipation as needed.     Diet general    Complete by:  As directed      Increase activity slowly as tolerated    Complete by:  As directed      Weight bearing as tolerated    Complete by:  As directed   Laterality:  left  Extremity:  Lower     Weight bearing as tolerated    Complete by:  As directed   Laterality:  left  Extremity:  Lower         the patient will be set up for home health physical therapy.  Follow-up Information   Follow up with GRAVES,JOHN L, MD. Schedule an appointment as soon as possible for a visit in 2 weeks.   Specialty:  Orthopedic Surgery   Contact information:   [address] [place] [address] [phone]        Signed: Matthew Folks 10/07/2013, 4:23 PM

## 2013-10-07 NOTE — Progress Notes (Signed)
Physical Therapy Treatment Patient Details Name: [name] Hughes MRN: [id number] DOB: 08-19-1965 Today's Date: 10/07/2013    History of Present Illness Pt admitted for L TKA    PT Comments    Patient progressing towards physical therapy goals, ambulating up to 115 feet with min guard for safety. Mild left knee instability with gait however, able to support herself safely with use of rolling walker and application of knee immobilizer. Pt will have 24 hour care at home and reports she would like to leave later today if possible. I feel she is safe for d/c from a mobility standpoint when medically ready. Will continue to work with her on gait training and safety with mobility.  Follow Up Recommendations  Home health PT     Equipment Recommendations  Rolling walker with 5" wheels;3in1 (PT)    Recommendations for Other Services       Precautions / Restrictions Precautions Precautions: Fall;Knee Required Braces or Orthoses: Knee Immobilizer - Left Knee Immobilizer - Left: On when out of bed or walking;Discontinue once straight leg raise with < 10 degree lag Restrictions Weight Bearing Restrictions: Yes LLE Weight Bearing: Weight bearing as tolerated    Mobility  Bed Mobility Overal bed mobility: Modified Independent                Transfers Overall transfer level: Needs assistance Equipment used: Rolling walker (2 wheeled) Transfers: Sit to/from Stand Sit to Stand: Min guard;From elevated surface         General transfer comment: Min guard for safety. VC for technique and hand placement. Bed slightly elevated simialr to home environment  Ambulation/Gait Ambulation/Gait assistance: Min guard Ambulation Distance (Feet): 115 Feet Assistive device: Rolling walker (2 wheeled) Gait Pattern/deviations: Step-to pattern;Decreased step length - right;Decreased stance time - left;Antalgic   Gait velocity interpretation: Below normal speed for age/gender General Gait Details:  Educated on safe DME use with rolling walker. VC for left knee extension in stance phase for quad activiation. Mild instability and buckling noted but able to self correct with use of rolling walker and knee immobilizer in place   Stairs            Wheelchair Mobility    Modified Rankin (Stroke Patients Only)       Balance                                    Cognition Arousal/Alertness: Awake/alert Behavior During Therapy: WFL for tasks assessed/performed Overall Cognitive Status: Within Functional Limits for tasks assessed                      Exercises Total Joint Exercises Ankle Circles/Pumps: AROM;Both;10 reps;Supine Quad Sets: AROM;Left;10 reps;Seated Long Arc Quad: AROM;Left;10 reps;Seated Knee Flexion: AROM;Left;10 reps;Seated    General Comments        Pertinent Vitals/Pain Pain Assessment: 0-10 Pain Score: 10-Worst pain ever Pain Location: left knee Pain Intervention(s): Limited activity within patient's tolerance;Monitored during session;Repositioned (Denies RN to be notified for pain meds)    Home Living                      Prior Function            PT Goals (current goals can now be found in the care plan section) Progress towards PT goals: Progressing toward goals    Frequency  7X/week    PT Plan  Current plan remains appropriate    Co-evaluation             End of Session   Activity Tolerance: Patient tolerated treatment well Patient left: in chair;with call bell/phone within reach;with family/visitor present     Time: 1610-9604 PT Time Calculation (min): 27 min  Charges:  $Gait Training: 8-22 mins $Therapeutic Exercise: 8-22 mins                    G Codes:      [employer], [place] [phone]  Berton Mount 10/07/2013, 9:57 AM

## 2013-10-07 NOTE — Progress Notes (Signed)
Physical Therapy Treatment Patient Details Name: [name] Hughes MRN: [id number] DOB: 26-Dec-1965 Today's Date: 10/07/2013    History of Present Illness Pt admitted for L TKA    PT Comments    Patient continues to progress well towards physical therapy goals, increasing ambulatory distance to 140 feet at a min guard level this afternoon while using a rolling walker. She is anxious to leave today and will have 24 hour care at home from family. All education has been completed and I feel that she is adequate for d/c from a mobility standpoint. Patient will continue to benefit from skilled physical therapy services at home with HHPT to further improve independence with functional mobility.   Follow Up Recommendations  Home health PT     Equipment Recommendations  Rolling walker with 5" wheels;3in1 (PT)    Recommendations for Other Services       Precautions / Restrictions Precautions Precautions: Fall;Knee Required Braces or Orthoses: Knee Immobilizer - Left Knee Immobilizer - Left: On when out of bed or walking;Discontinue once straight leg raise with < 10 degree lag Restrictions Weight Bearing Restrictions: Yes LLE Weight Bearing: Weight bearing as tolerated    Mobility  Bed Mobility Overal bed mobility: Modified Independent                Transfers Overall transfer level: Needs assistance Equipment used: Rolling walker (2 wheeled) Transfers: Sit to/from Stand Sit to Stand: Supervision         General transfer comment: Supervision for safety. Performed from recliner with correct hand palcement, sat onto lowest bed setting with good control.  Ambulation/Gait Ambulation/Gait assistance: Min guard Ambulation Distance (Feet): 140 Feet Assistive device: Rolling walker (2 wheeled) Gait Pattern/deviations: Step-to pattern;Step-through pattern;Decreased step length - right;Decreased stance time - left;Antalgic   Gait velocity interpretation: Below normal speed for  age/gender General Gait Details: VC for left knee extension and glute activation in stance phase for quad and glute activation, allowing upright posture without antalgic type gait. Focused on step-through gait pattern which is slowly emerging. Continues to have mild instability with buckling however safely corrects herself with use of RW and knee immobilizer.   Stairs            Wheelchair Mobility    Modified Rankin (Stroke Patients Only)       Balance                                    Cognition Arousal/Alertness: Awake/alert Behavior During Therapy: WFL for tasks assessed/performed Overall Cognitive Status: Within Functional Limits for tasks assessed                      Exercises Total Joint Exercises Ankle Circles/Pumps: AROM;Both;10 reps;Supine Quad Sets: AROM;Left;10 reps;Seated Long Arc Quad: Left;10 reps;Seated;AAROM Knee Flexion: AROM;Left;10 reps;Seated Goniometric ROM: 8-86 degrees left knee flexion    General Comments        Pertinent Vitals/Pain Pain Assessment: 0-10 Pain Score:  ("Hurts when my knee is straight") Pain Location: Lt knee Pain Intervention(s): Limited activity within patient's tolerance;Monitored during session;Repositioned;Patient requesting pain meds-RN notified    Home Living                      Prior Function            PT Goals (current goals can now be found in the care plan section) Acute Rehab  PT Goals PT Goal Formulation: With patient/family Time For Goal Achievement: 10/20/13 Potential to Achieve Goals: Good Progress towards PT goals: Progressing toward goals    Frequency  7X/week    PT Plan Current plan remains appropriate    Co-evaluation             End of Session Equipment Utilized During Treatment: Left knee immobilizer Activity Tolerance: Patient tolerated treatment well Patient left: with call bell/phone within reach;with family/visitor present;in bed     Time:  1610-9604 PT Time Calculation (min): 27 min  Charges:  $Gait Training: 8-22 mins $Therapeutic Exercise: 8-22 mins                    G Codes:      [employer] [employer], [place] [phone]  Berton Mount 10/07/2013, 3:51 PM

## 2013-10-10 ENCOUNTER — Encounter (HOSPITAL_COMMUNITY): Payer: Self-pay | Admitting: Orthopedic Surgery

## 2014-09-02 ENCOUNTER — Ambulatory Visit (INDEPENDENT_AMBULATORY_CARE_PROVIDER_SITE_OTHER): Payer: Managed Care, Other (non HMO) | Admitting: Family Medicine

## 2014-09-02 VITALS — BP 102/58 | HR 59 | Temp 97.8°F | Resp 12 | Ht 65.5 in | Wt 266.4 lb

## 2014-09-02 DIAGNOSIS — R3911 Hesitancy of micturition: Secondary | ICD-10-CM

## 2014-09-02 DIAGNOSIS — M5432 Sciatica, left side: Secondary | ICD-10-CM

## 2014-09-02 DIAGNOSIS — I10 Essential (primary) hypertension: Secondary | ICD-10-CM | POA: Diagnosis not present

## 2014-09-02 DIAGNOSIS — R300 Dysuria: Secondary | ICD-10-CM | POA: Diagnosis not present

## 2014-09-02 LAB — POCT UA - MICROSCOPIC ONLY
Bacteria, U Microscopic: NEGATIVE
CASTS, UR, LPF, POC: NEGATIVE
Crystals, Ur, HPF, POC: NEGATIVE
Mucus, UA: POSITIVE
RBC, urine, microscopic: NEGATIVE
YEAST UA: NEGATIVE

## 2014-09-02 LAB — COMPREHENSIVE METABOLIC PANEL
ALBUMIN: 3.6 g/dL (ref 3.6–5.1)
ALK PHOS: 80 U/L (ref 33–115)
ALT: 19 U/L (ref 6–29)
AST: 19 U/L (ref 10–35)
BUN: 8 mg/dL (ref 7–25)
CALCIUM: 9.2 mg/dL (ref 8.6–10.2)
CHLORIDE: 101 mmol/L (ref 98–110)
CO2: 30 mmol/L (ref 20–31)
Creat: 0.86 mg/dL (ref 0.50–1.10)
Glucose, Bld: 86 mg/dL (ref 65–99)
Potassium: 3.3 mmol/L — ABNORMAL LOW (ref 3.5–5.3)
SODIUM: 141 mmol/L (ref 135–146)
TOTAL PROTEIN: 6.4 g/dL (ref 6.1–8.1)
Total Bilirubin: 0.7 mg/dL (ref 0.2–1.2)

## 2014-09-02 LAB — POCT URINALYSIS DIPSTICK
Bilirubin, UA: NEGATIVE
Blood, UA: NEGATIVE
Glucose, UA: NEGATIVE
Ketones, UA: NEGATIVE
Leukocytes, UA: NEGATIVE
Nitrite, UA: NEGATIVE
Protein, UA: NEGATIVE
SPEC GRAV UA: 1.02
UROBILINOGEN UA: 0.2
pH, UA: 7

## 2014-09-02 LAB — POCT CBC
Granulocyte percent: 66.5 %G (ref 37–80)
HCT, POC: 35.8 % — AB (ref 37.7–47.9)
Hemoglobin: 11.3 g/dL — AB (ref 12.2–16.2)
LYMPH, POC: 2.2 (ref 0.6–3.4)
MCH: 27.4 pg (ref 27–31.2)
MCHC: 31.4 g/dL — AB (ref 31.8–35.4)
MCV: 87.2 fL (ref 80–97)
MID (CBC): 0.2 (ref 0–0.9)
MPV: 8.7 fL (ref 0–99.8)
PLATELET COUNT, POC: 215 10*3/uL (ref 142–424)
POC Granulocyte: 4.9 (ref 2–6.9)
POC LYMPH %: 30.4 % (ref 10–50)
POC MID %: 3.1 % (ref 0–12)
RBC: 4.11 M/uL (ref 4.04–5.48)
RDW, POC: 15.2 %
WBC: 7.4 10*3/uL (ref 4.6–10.2)

## 2014-09-02 NOTE — Patient Instructions (Signed)
1. Increase water intake for the next week. 2. Call Dr. Luiz Blare' office tomorrow for follow-up on sciatica pain. 3. If you are still having difficulty with urination in one week, please call office for referral to urologist.  You should urinate atleast once every 8 hours.

## 2014-09-02 NOTE — Progress Notes (Signed)
Subjective:    Patient ID: [name] Hughes, female    DOB: 1965/02/26, 49 y.o.   MRN: [id number] This chart was scribed for Nilda Simmer, MD by Littie Deeds, Medical Scribe. This patient was seen in Room 2 and the patient's care was started at 10:57 AM.   09/02/2014  Sciatica and Urinary Urgency   HPI  HPI Comments: [name] Hughes is a 49 y.o. female with a history of chronic back pain and obesity who presents to the [facility] complaining of difficulty urinating that started about 5 days ago. She reports having urinary urgency and mild dysuria. She has been drinking plenty of water. Patient denies hematuria, nocturia, fever, chills, diaphoresis, nausea, and vomiting. She last voided while at the office today; she did not have much difficulty because she had been holding her urine for a while.  Patient is urinating atleast every four hours but at decreased volumes from baseline.  Patient also reports having left-sided back pain radiating down her legs that started worsening 2 weeks ago, which she states is due to a sciatic nerve. The pain is rated as a 7.5/10 in severity. She believes her back pain started again because she recently started working again after a period of unemployment. She has to sit often and thinks her pain may be due to the chair she sits in. Patient denies numbness, tingling, weakness, saddle numbness, bowel symptoms, chest pain, SOB, fatigue, and dizziness. She seeing Dr. Luiz Blare about 3 months ago and had XR imaging done there, which showed bone spurs. She was treated with prednisone, which she tolerated well, and also had a cortisone shot in the office. Patient called the office for her recent back pain and was given a prescription for tramadol. About a week ago, patient reports taking more than the recommended dose of oxycodone-acetaminophen - twice every 4 hours for 2 days. She did not read the box and thought she was taking regular Tylenol. It was around this  time when she first noticed her urinary symptoms. Per patient, her blood pressure is usually around 110/60; denies dizziness, fatigue, SOB, chest pain.  PCP: Dr. Renne Crigler  Review of Systems  Constitutional: Negative for fever, chills, diaphoresis and fatigue.  Respiratory: Negative for shortness of breath.   Cardiovascular: Negative for chest pain.  Gastrointestinal: Negative for nausea, vomiting, diarrhea and constipation.  Genitourinary: Positive for dysuria, urgency and difficulty urinating. Negative for frequency, hematuria and flank pain.  Musculoskeletal: Positive for myalgias and back pain.  Neurological: Negative for dizziness, weakness and numbness.    Past Medical History  Diagnosis Date  . HTN (hypertension)     takes Diovan HCT daily  . Hypothyroid     takes Synthroid daily  . Headache(784.0)   . Arthritis   . Joint pain   . Joint swelling   . Chronic back pain     reason unknown but thinks related to knees   Past Surgical History  Procedure Laterality Date  . Abdominal hysterectomy    . Gallbladder surgery    . Knee arthroscopy Bilateral   . Cesarean section    . 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 Total knee arthroplasty Left 10/06/2013    Procedure: LEFT TOTAL KNEE ARTHROPLASTY;  Surgeon: Harvie Junior, MD;  Location: [facility] OR;  Service: Orthopedics;  Laterality: Left;   No Known Allergies Current Outpatient Prescriptions  Medication Sig Dispense Refill  . SYNTHROID 112 MCG tablet Take 112  mcg by mouth daily before breakfast.     . valsartan-hydrochlorothiazide (DIOVAN-HCT) 320-25 MG per tablet Take 1 tablet by mouth daily.     Marland Kitchen aspirin EC 325 MG tablet Take 1 tablet (325 mg total) by mouth 2 (two) times daily after a meal. Take until 1 month post op to decrease risk of blood clots. (Patient not taking: Reported on 09/02/2014) 60 tablet 0  . methocarbamol (ROBAXIN-750) 750 MG tablet Take 1 tablet (750 mg total) by mouth every 8 (eight) hours as  needed for muscle spasms. (Patient not taking: Reported on 09/02/2014) 40 tablet 0  . oxyCODONE-acetaminophen (PERCOCET/ROXICET) 5-325 MG per tablet Take 1-2 tablets by mouth every 6 (six) hours as needed for severe pain. (Patient not taking: Reported on 09/02/2014) 60 tablet 0   No current facility-administered medications for this visit.   History   Social History  . Marital Status: Married    Spouse Name: N/A  . Number of Children: N/A  . Years of Education: N/A   Occupational History  . Not on file.   Social History Main Topics  . Smoking status: Current Every Day Smoker -- 0.50 packs/day for 10 years    Types: Cigarettes  . Smokeless tobacco: Never Used  . Alcohol Use: Yes     Comment: occasionally  . Drug Use: No  . Sexual Activity: Yes    Birth Control/ Protection: Surgical   Other Topics Concern  . Not on file   Social History Narrative       Objective:    BP 102/58 mmHg  Pulse 59  Temp(Src) 97.8 F (36.6 C) (Oral)  Resp 12  Ht 5' 5.5" (1.664 m)  Wt 266 lb 6.4 oz (120.838 kg)  BMI 43.64 kg/m2  SpO2 97% Physical Exam  Constitutional: She is oriented to person, place, and time. She appears well-developed and well-nourished. No distress.  obese  HENT:  Head: Normocephalic and atraumatic.  Mouth/Throat: Oropharynx is clear and moist. No oropharyngeal exudate.  Eyes: Pupils are equal, round, and reactive to light.  Neck: Neck supple.  Cardiovascular: Normal rate and regular rhythm.   Murmur heard. 2/6 murmur  Pulmonary/Chest: Effort normal and breath sounds normal. No respiratory distress. She has no wheezes. She has no rales.  Clear to auscultation bilaterally.   Abdominal: Soft. Bowel sounds are normal. She exhibits no distension and no mass. There is tenderness in the suprapubic area. There is no rebound and no guarding.  No palpable bladder.  Musculoskeletal: She exhibits tenderness. She exhibits no edema.       Lumbar back: She exhibits decreased  range of motion and pain. She exhibits no tenderness, no bony tenderness and no spasm.  Lumbar spine:  Non-tender midline; non-tender paraspinal regions B; toe and heel walking intact; marching intact; motor 5/5 BLE.  Full ROM lumbar spine pain with flexion. Positive straight leg raise on the left. No edema or swelling.  Neurological: She is alert and oriented to person, place, and time. No cranial nerve deficit.  Skin: Skin is warm and dry. No rash noted. She is not diaphoretic.  Psychiatric: She has a normal mood and affect. Her behavior is normal.  Vitals reviewed.  Results for orders placed or performed in visit on 09/02/14  POCT urinalysis dipstick  Result Value Ref Range   Color, UA yellow    Clarity, UA clear    Glucose, UA neg    Bilirubin, UA neg    Ketones, UA neg    Spec Grav,  UA 1.020    Blood, UA neg    pH, UA 7.0    Protein, UA neg    Urobilinogen, UA 0.2    Nitrite, UA neg    Leukocytes, UA Negative Negative  POCT UA - Microscopic Only  Result Value Ref Range   WBC, Ur, HPF, POC 0-1    RBC, urine, microscopic neg    Bacteria, U Microscopic neg    Mucus, UA pos    Epithelial cells, urine per micros 2-4    Crystals, Ur, HPF, POC neg    Casts, Ur, LPF, POC neg    Yeast, UA neg   POCT CBC  Result Value Ref Range   WBC 7.4 4.6 - 10.2 K/uL   Lymph, poc 2.2 0.6 - 3.4   POC LYMPH PERCENT 30.4 10 - 50 %L   MID (cbc) 0.2 0 - 0.9   POC MID % 3.1 0 - 12 %M   POC Granulocyte 4.9 2 - 6.9   Granulocyte percent 66.5 37 - 80 %G   RBC 4.11 4.04 - 5.48 M/uL   Hemoglobin 11.3 (A) 12.2 - 16.2 g/dL   HCT, POC 40.1 (A) 02.7 - 47.9 %   MCV 87.2 80 - 97 fL   MCH, POC 27.4 27 - 31.2 pg   MCHC 31.4 (A) 31.8 - 35.4 g/dL   RDW, POC 25.3 %   Platelet Count, POC 215 142 - 424 K/uL   MPV 8.7 0 - 99.8 fL       Assessment & Plan:   1. Dysuria   2. Urinary hesitancy   3. Sciatica, left   4. Essential hypertension     1. Dysuria/urgency: New. Send urine culture; increase  water intake for next week. If no improvement with hesitancy in one week, call for urology referral. Advised needs to urinate atleast every 8 hours.  Obtain labs. 2.  L Sciatica: worsening in past two weeks due to prolonged sitting at work. No suggestion of cauda equina syndrome at this time.  Pt to contact Dr. Luiz Blare for follow-up appointment tomorrow. 3. HTN: borderline low; this is patient's baseline BP by report; asymptomatic.  No orders of the defined types were placed in this encounter.    No Follow-up on file.    I personally performed the services described in this documentation, which was scribed in my presence. The recorded information has been reviewed and considered.  Wakeelah Solan Paulita Fujita, M.D. [facility] [address] [place], [place]  [address] [phone] phone [phone] fax

## 2014-09-03 LAB — URINE CULTURE
COLONY COUNT: NO GROWTH
Organism ID, Bacteria: NO GROWTH

## 2014-09-04 MED ORDER — POTASSIUM CHLORIDE CRYS ER 20 MEQ PO TBCR: 20.0000 meq | EXTENDED_RELEASE_TABLET | Freq: Every day | ORAL | Status: DC

## 2014-09-04 NOTE — Addendum Note (Signed)
Addended by: Ethelda Chick on: 09/04/2014 11:10 AM   Modules accepted: Orders

## 2014-10-09 ENCOUNTER — Other Ambulatory Visit: Payer: Self-pay | Admitting: Orthopedic Surgery

## 2014-10-09 DIAGNOSIS — M5442 Lumbago with sciatica, left side: Secondary | ICD-10-CM

## 2014-11-19 ENCOUNTER — Encounter (HOSPITAL_COMMUNITY): Payer: Self-pay | Admitting: Emergency Medicine

## 2014-11-19 ENCOUNTER — Emergency Department (HOSPITAL_COMMUNITY): Payer: 59

## 2014-11-19 ENCOUNTER — Emergency Department (HOSPITAL_COMMUNITY)
Admission: EM | Admit: 2014-11-19 | Discharge: 2014-11-19 | Disposition: A | Discharge status: Home / Self Care | Payer: 59 | Attending: Emergency Medicine | Admitting: Emergency Medicine

## 2014-11-19 DIAGNOSIS — G8929 Other chronic pain: Secondary | ICD-10-CM | POA: Insufficient documentation

## 2014-11-19 DIAGNOSIS — R079 Chest pain, unspecified: Secondary | ICD-10-CM | POA: Diagnosis present

## 2014-11-19 DIAGNOSIS — I10 Essential (primary) hypertension: Secondary | ICD-10-CM | POA: Insufficient documentation

## 2014-11-19 DIAGNOSIS — Z79899 Other long term (current) drug therapy: Secondary | ICD-10-CM | POA: Insufficient documentation

## 2014-11-19 DIAGNOSIS — R61 Generalized hyperhidrosis: Secondary | ICD-10-CM | POA: Diagnosis not present

## 2014-11-19 DIAGNOSIS — E039 Hypothyroidism, unspecified: Secondary | ICD-10-CM | POA: Insufficient documentation

## 2014-11-19 DIAGNOSIS — J40 Bronchitis, not specified as acute or chronic: Secondary | ICD-10-CM | POA: Insufficient documentation

## 2014-11-19 DIAGNOSIS — Z720 Tobacco use: Secondary | ICD-10-CM | POA: Insufficient documentation

## 2014-11-19 DIAGNOSIS — M1990 Unspecified osteoarthritis, unspecified site: Secondary | ICD-10-CM | POA: Insufficient documentation

## 2014-11-19 DIAGNOSIS — Z7982 Long term (current) use of aspirin: Secondary | ICD-10-CM | POA: Insufficient documentation

## 2014-11-19 LAB — BASIC METABOLIC PANEL
ANION GAP: 8 (ref 5–15)
BUN: 6 mg/dL (ref 6–20)
CHLORIDE: 102 mmol/L (ref 101–111)
CO2: 29 mmol/L (ref 22–32)
Calcium: 9 mg/dL (ref 8.9–10.3)
Creatinine, Ser: 0.93 mg/dL (ref 0.44–1.00)
GFR calc non Af Amer: >60 mL/min (ref >60)
Glucose, Bld: 106 mg/dL — ABNORMAL HIGH (ref 65–99)
POTASSIUM: 3.1 mmol/L — AB (ref 3.5–5.1)
Sodium: 139 mmol/L (ref 135–145)

## 2014-11-19 LAB — CBC
HEMATOCRIT: 35.4 % — AB (ref 36.0–46.0)
HEMOGLOBIN: 11.6 g/dL — AB (ref 12.0–15.0)
MCH: 28.2 pg (ref 26.0–34.0)
MCHC: 32.8 g/dL (ref 30.0–36.0)
MCV: 85.9 fL (ref 78.0–100.0)
Platelets: 229 10*3/uL (ref 150–400)
RBC: 4.12 MIL/uL (ref 3.87–5.11)
RDW: 13 % (ref 11.5–15.5)
WBC: 7.9 10*3/uL (ref 4.0–10.5)

## 2014-11-19 LAB — I-STAT TROPONIN, ED: Troponin i, poc: 0 ng/mL (ref 0.00–0.08)

## 2014-11-19 MED ORDER — ALBUTEROL SULFATE HFA 108 (90 BASE) MCG/ACT IN AERS
2.0000 | INHALATION_SPRAY | Freq: Once | RESPIRATORY_TRACT | Status: AC
  Administered 2014-11-19: 2 via RESPIRATORY_TRACT
  Filled 2014-11-19: qty 6.7

## 2014-11-19 NOTE — ED Provider Notes (Signed)
CSN: [id number]   Arrival date & time 11/19/14 0107  History  By signing my name below, I, Bethel Born, attest that this documentation has been prepared under the direction and in the presence of Zadie Rhine, MD. Electronically Signed: Bethel Born, ED Scribe. 11/19/2014. 1:57 AM.  Chief Complaint  Patient presents with  . Chest Pain  . Shortness of Breath    HPI Patient is a 49 y.o. female presenting with shortness of breath. The history is provided by the patient. No language interpreter was used.  Shortness of Breath Severity:  Moderate Onset quality:  Sudden Timing:  Constant Progression:  Unchanged Chronicity:  New Context: URI   Relieved by:  Nothing Worsened by:  Nothing tried Ineffective treatments:  None tried Associated symptoms: chest pain, cough, diaphoresis and headaches   Associated symptoms: no abdominal pain, no fever, no hemoptysis, no syncope and no vomiting    [name] Hughes is a 49 y.o. female with PMHx of HTN and hypothyroidism who presents to the Emergency Department complaining of SOB with sudden onset tonight. Pt notes that she has been feeling ill for 5 days with cold symptoms (cough, chest congestion, and headache). She has been treating the symptoms with Mucinex and Tylenol. Tonight she woke up and felt as if she could not breathe. Associated symptoms include sweating, an episode of chest tightness/heaviness that lasted for 1-2 hours (pain free at present) , and nausea. Pt denies hemoptysis, vomiting abdominal pain, LE pain or swelling.  Past Medical History  Diagnosis Date  . HTN (hypertension)     takes Diovan HCT daily  . Hypothyroid     takes Synthroid daily  . Headache(784.0)   . Arthritis   . Joint pain   . Joint swelling   . Chronic back pain     reason unknown but thinks related to knees    Past Surgical History  Procedure Laterality Date  . Abdominal hysterectomy    . Gallbladder surgery    . Knee arthroscopy Bilateral   .  Cesarean section    . 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 Hyperlipidemia Father   . Heart attack Brother   . Stroke Paternal Grandmother     Social History  Substance Use Topics  . Smoking status: Current Every Day Smoker -- 0.50 packs/day for 10 years    Types: Cigarettes  . Smokeless tobacco: Never Used  . Alcohol Use: Yes     Comment: occasionally     Review of Systems  Constitutional: Positive for diaphoresis. Negative for fever.  Respiratory: Positive for cough, chest tightness and shortness of breath. Negative for hemoptysis.   Cardiovascular: Positive for chest pain. Negative for syncope.  Gastrointestinal: Negative for vomiting and abdominal pain.  Neurological: Positive for headaches.  All other systems reviewed and are negative.  Home Medications   Prior to Admission medications   Medication Sig Start Date End Date Taking? Authorizing Provider  aspirin EC 325 MG tablet Take 1 tablet (325 mg total) by mouth 2 (two) times daily after a meal. Take until 1 month post op to decrease risk of blood clots. Patient not taking: Reported on 09/02/2014 10/07/13   Marshia Ly, PA-C  methocarbamol (ROBAXIN-750) 750 MG tablet  Take 1 tablet (750 mg total) by mouth every 8 (eight) hours as needed for muscle spasms. Patient not taking: Reported on 09/02/2014 10/06/13   Marshia LyJames Bethune, PA-C  oxyCODONE-acetaminophen (PERCOCET/ROXICET) 5-325 MG per tablet Take 1-2 tablets by mouth every 6 (six) hours as needed for severe pain. Patient not taking: Reported on 09/02/2014 10/06/13   Marshia LyJames Bethune, PA-C  potassium chloride SA (K-DUR,KLOR-CON) 20 MEQ tablet Take 1 tablet (20 mEq total) by mouth daily. 09/04/14   Ethelda ChickKristi M Smith, MD  SYNTHROID 112 MCG tablet Take  112 mcg by mouth daily before breakfast.  07/18/13   Historical Provider, MD  valsartan-hydrochlorothiazide (DIOVAN-HCT) 320-25 MG per tablet Take 1 tablet by mouth daily.  07/14/13   Historical Provider, MD    Allergies  Review of patient's allergies indicates no known allergies.  Triage Vitals: BP 122/57 mmHg  Pulse 65  Temp(Src) 97.4 F (36.3 C) (Oral)  Resp 20  SpO2 93%  Physical Exam CONSTITUTIONAL: Well developed/well nourished HEAD: Normocephalic/atraumatic EYES: EOMI/PERRL ENMT: Mucous membranes moist, uvula midline, no exudate noted. NECK: supple no meningeal signs SPINE/BACK:entire spine nontender CV: S1/S2 noted, no murmurs/rubs/gallops noted LUNGS: Lungs are clear to auscultation bilaterally, no apparent distress ABDOMEN: soft, nontender, no rebound or guarding, bowel sounds noted throughout abdomen GU:no cva tenderness NEURO: Pt is awake/alert/appropriate, moves all extremitiesx4.  No facial droop.   EXTREMITIES: pulses normal/equal, full ROM SKIN: warm, color normal PSYCH: no abnormalities of mood noted, alert and oriented to situation  ED Course  Procedures   DIAGNOSTIC STUDIES: Oxygen Saturation is 93% on RA, adequate by my interpretation.    COORDINATION OF CARE: 1:53 AM Discussed treatment plan which includes lab work, EKG, CXR, an an inhaler with pt at bedside and pt agreed to plan.  Initial workup negative EKG has some artifact but otherwise unchanged from prior CXR negative Pt reports cough/congestion and woke up with SOB and some chest tightness She is now at baseline My suspicion is this is related to her recent URI symptoms.  However she does have some risk factors for CAD I advised we could monitor and repeat troponin.  Pt preferred to go home We discussed strict ER return precautions Advised her to quit smoking She denies pleuritic CP, I doubt acute PE BP 116/51 mmHg  Pulse 57  Temp(Src) 97.4 F (36.3 C) (Oral)  Resp 16  SpO2  97%   Labs Reviewed  BASIC METABOLIC PANEL - Abnormal; Notable for the following:    Potassium 3.1 (*)    Glucose, Bld 106 (*)    All other components within normal limits  CBC - Abnormal; Notable for the following:    Hemoglobin 11.6 (*)    HCT 35.4 (*)    All other components within normal limits  I-STAT TROPOININ, ED    Imaging Review Dg Chest 2 View  11/19/2014  CLINICAL DATA:  Centralized chest pain and shortness of breath tonight. EXAM: CHEST  2 VIEW COMPARISON:  09/26/2013 FINDINGS: The cardiomediastinal contours are normal. The lungs are clear. Pulmonary vasculature is normal. No consolidation, pleural effusion, or pneumothorax. No acute osseous abnormalities are seen. Degenerative change again seen in the spine. IMPRESSION: No acute pulmonary process. Electronically Signed   By: Rubye OaksMelanie  Ehinger M.D.   On: 11/19/2014 01:59    I personally reviewed and evaluated these images and lab results as a part of my medical decision-making.   EKG Interpretation  Date/Time:  Monday November 19 2014 01:19:03 EDT Ventricular Rate:  66 PR Interval:  136 QRS Duration: 84 QT Interval:  460 QTC Calculation: 482 R Axis:   70 Text Interpretation:  Normal sinus rhythm Nonspecific T wave abnormality Abnormal ECG artifact noted Confirmed by Bebe Shaggy  MD, Morgana Rowley ([id number]) on 11/19/2014 1:25:28 AM    MDM   Final diagnoses:  Chest pain, unspecified chest pain type  Bronchitis     Nursing notes including past medical history and social history reviewed and considered in documentation xrays/imaging reviewed by myself and considered during evaluation Labs/vital reviewed myself and considered during evaluation   I, Joya Gaskins, personally performed the services described in this documentation. All medical record entries made by the scribe were at my direction and in my presence.  I have reviewed the chart and discharge instructions and agree that the record reflects my personal  performance and is accurate and complete. Joya Gaskins.  11/19/2014. 2:37 AM.     Zadie Rhine, MD 11/19/14 [phone]

## 2014-11-19 NOTE — ED Notes (Signed)
Pt stable, ambulatory, stats understanding of discharge instructions

## 2014-11-19 NOTE — Discharge Instructions (Signed)

## 2014-11-19 NOTE — ED Notes (Signed)
Reports cold symptoms x 1 week.  Woke up around 11:45pm  with chest heaviness, sob, diaphoresis, and nausea.  Reports productive cough with yellow phlegm.

## 2014-12-19 ENCOUNTER — Other Ambulatory Visit: Payer: Self-pay | Admitting: Orthopedic Surgery

## 2014-12-19 DIAGNOSIS — M79605 Pain in left leg: Secondary | ICD-10-CM

## 2014-12-19 DIAGNOSIS — R609 Edema, unspecified: Secondary | ICD-10-CM

## 2014-12-26 ENCOUNTER — Ambulatory Visit
Admission: RE | Admit: 2014-12-26 | Discharge: 2014-12-26 | Disposition: A | Discharge status: Home / Self Care | Payer: No Typology Code available for payment source | Source: Ambulatory Visit | Attending: Orthopedic Surgery | Admitting: Orthopedic Surgery

## 2014-12-26 DIAGNOSIS — M79605 Pain in left leg: Secondary | ICD-10-CM

## 2014-12-26 DIAGNOSIS — R609 Edema, unspecified: Secondary | ICD-10-CM

## 2016-04-02 ENCOUNTER — Other Ambulatory Visit: Payer: Self-pay | Admitting: Orthopedic Surgery

## 2016-04-02 DIAGNOSIS — M5416 Radiculopathy, lumbar region: Secondary | ICD-10-CM

## 2016-04-10 ENCOUNTER — Ambulatory Visit
Admission: RE | Admit: 2016-04-10 | Discharge: 2016-04-10 | Disposition: A | Discharge status: Home / Self Care | Payer: 59 | Source: Ambulatory Visit | Attending: Orthopedic Surgery | Admitting: Orthopedic Surgery

## 2016-04-10 DIAGNOSIS — M5416 Radiculopathy, lumbar region: Secondary | ICD-10-CM

## 2016-04-12 ENCOUNTER — Other Ambulatory Visit: Payer: No Typology Code available for payment source

## 2016-07-31 IMAGING — US US EXTREM LOW VENOUS*L*
1 series · 13 of 24 positions shown · non-contrast
Comparison: None.

CLINICAL DATA: Left lower extremity pain and edema.



[Series 1: us extrem low venous*left* · 13 of 29 slices shown]
[im 1/29]
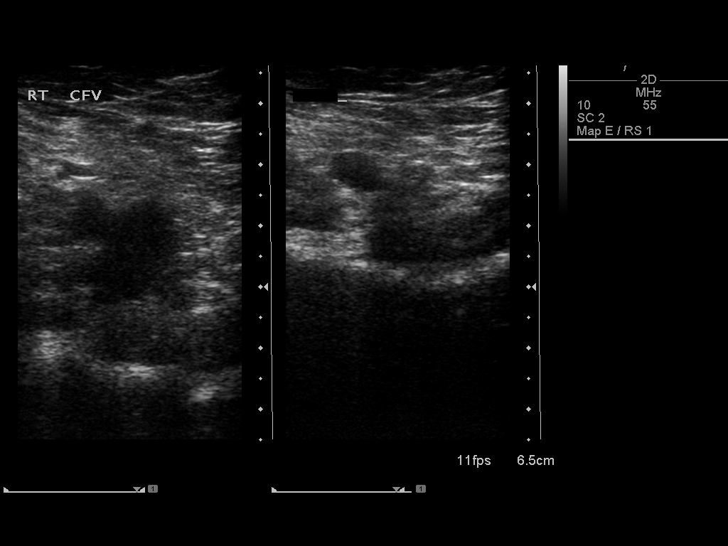
[im 3/29]
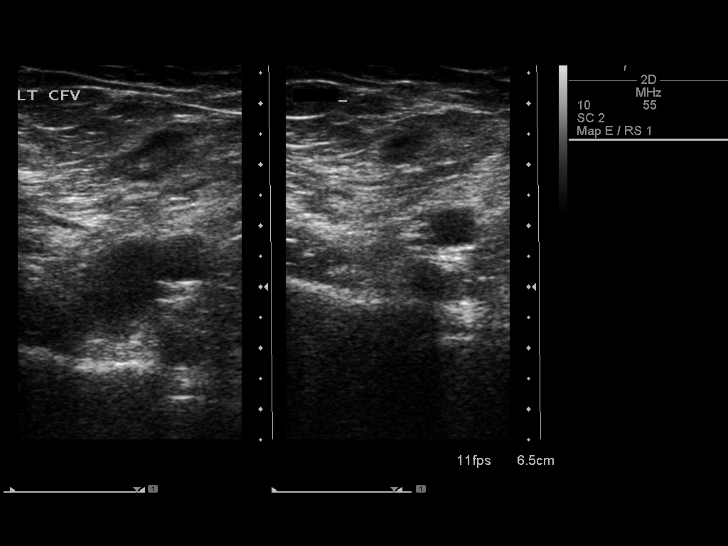
[im 5/29]
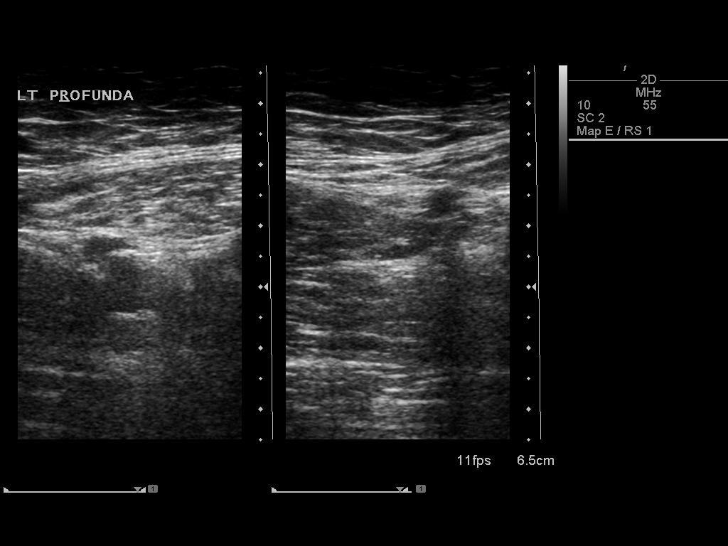
[im 8/29]
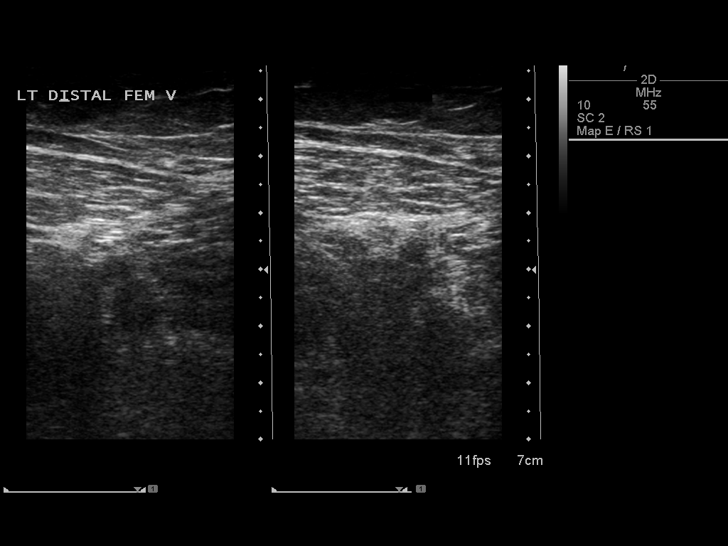
[im 10/29]
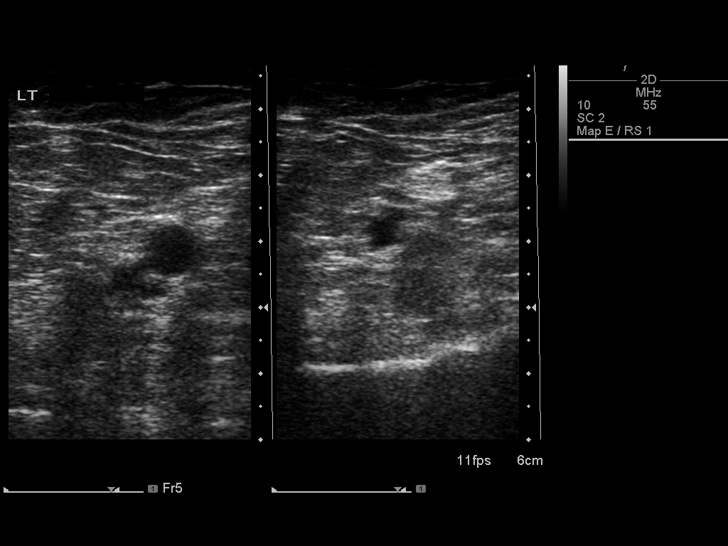
[im 13/29]
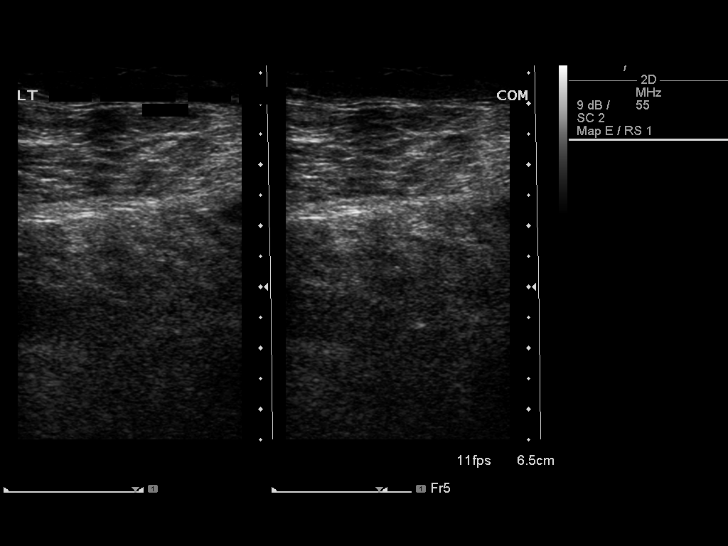
[im 15/29]
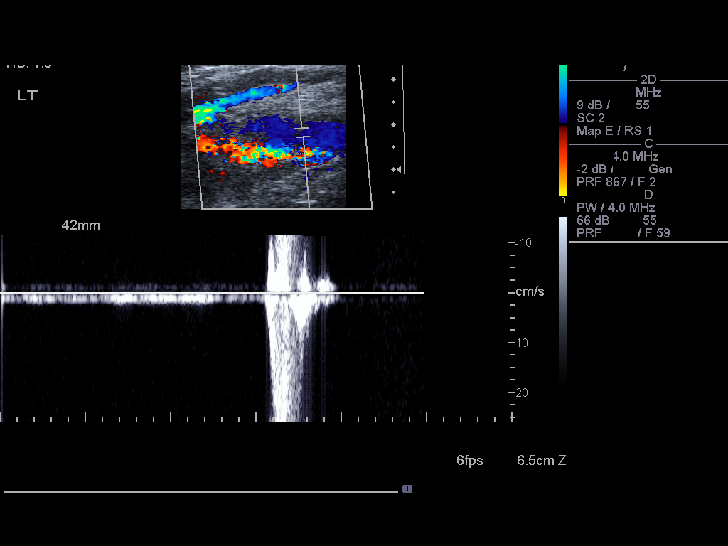
[im 16/29]
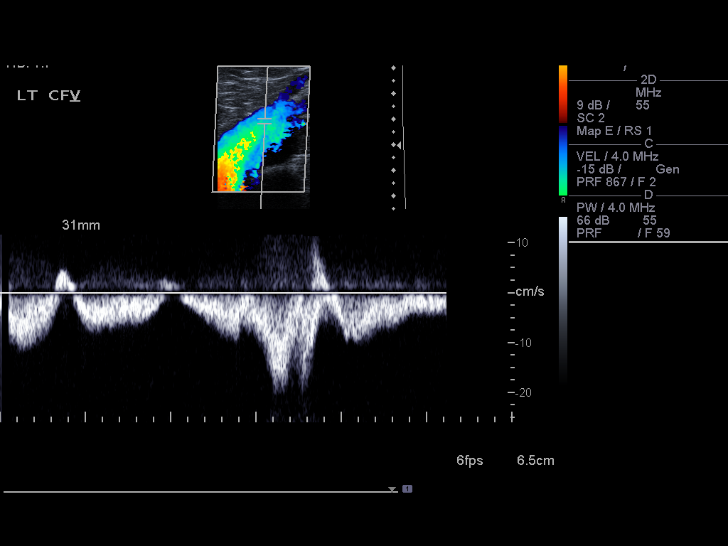
[im 19/29]
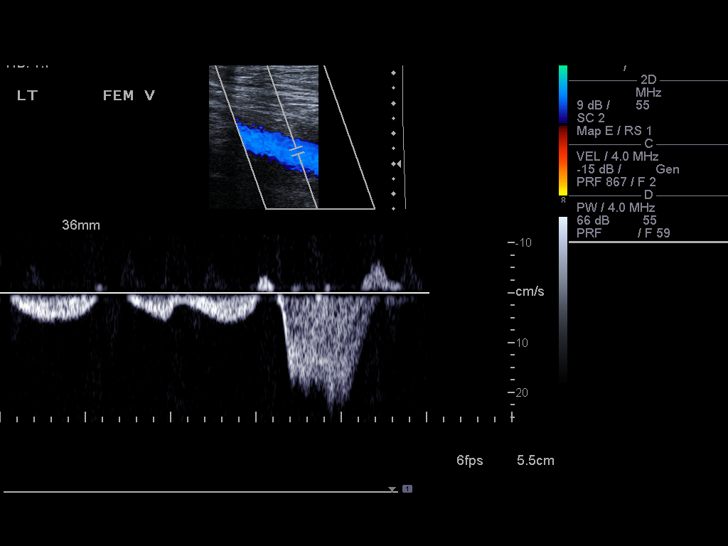
[im 21/29]
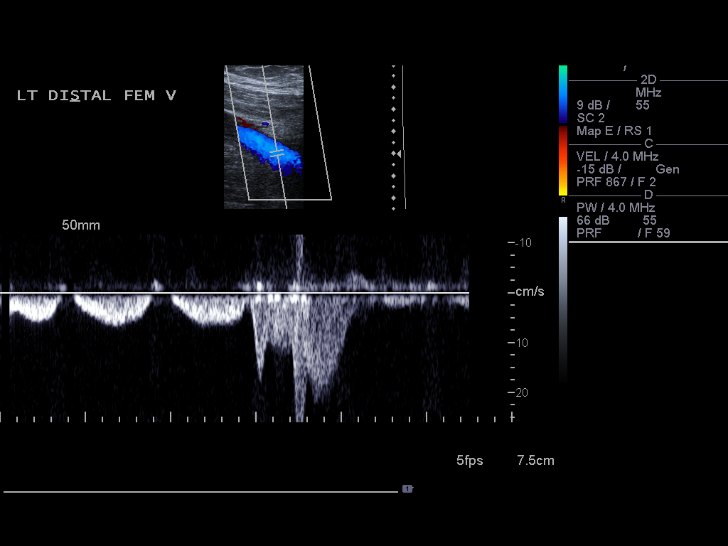
[im 24/29]
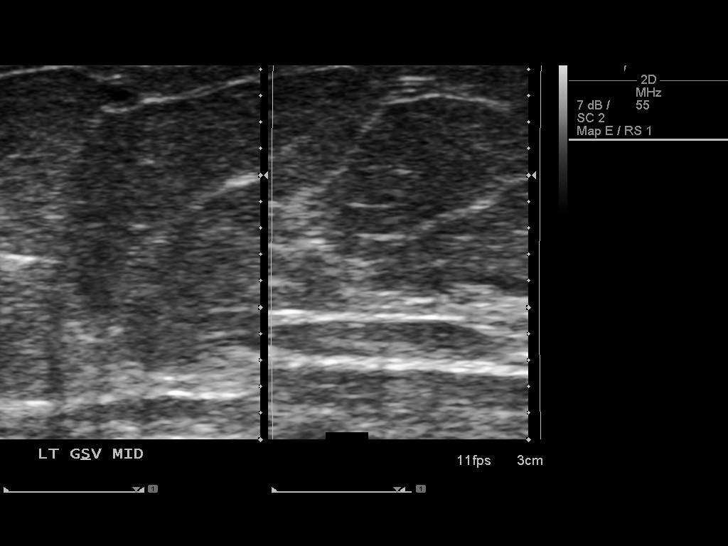
[im 26/29]
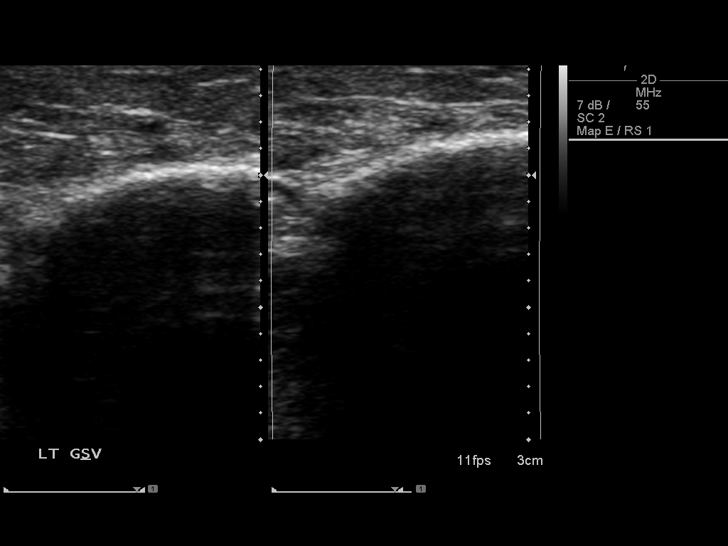
[im 29/29]
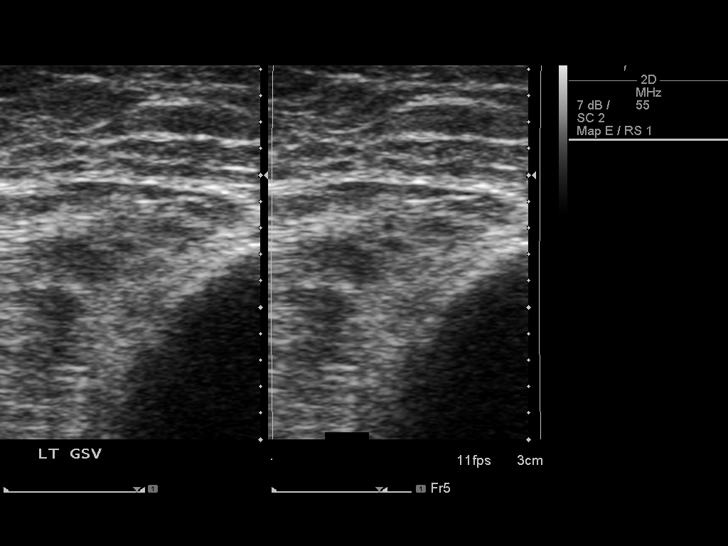

[13 of 24 positions shown; findings below may reference images not displayed]

FINDINGS: Contralateral Common Femoral Vein: Respiratory phasicity is normal
and symmetric with the symptomatic side. No evidence of thrombus.
Normal compressibility.

Common Femoral Vein: No evidence of thrombus. Normal
compressibility, respiratory phasicity and response to augmentation.

Saphenofemoral Junction: No evidence of thrombus. Normal
compressibility and flow on color Doppler imaging.

Profunda Femoral Vein: No evidence of thrombus. Normal
compressibility and flow on color Doppler imaging.

Femoral Vein: No evidence of thrombus. Normal compressibility,
respiratory phasicity and response to augmentation.

Popliteal Vein: No evidence of thrombus. Normal compressibility,
respiratory phasicity and response to augmentation.

Calf Veins: Limited assessment of the calf veins. Portions of the
posterior tibial vein appear patent. Peroneal vein not visualized.

Superficial Great Saphenous Vein: No evidence of thrombus. Normal
compressibility and flow on color Doppler imaging.

Venous Reflux:  None.

Other Findings:  None.
IMPRESSION: No significant left lower extremity femoral popliteal DVT. Limited
assessment of the calf veins.

## 2017-11-14 IMAGING — MR MR LUMBAR SPINE W/O CM
4 of 5 series · 19 of 48 positions shown · non-contrast
Comparison: None.

CLINICAL DATA: 50 y/o F; lower back pain radiating down the right
leg with numbness in the foot.

EXAM:
MRI LUMBAR SPINE WITHOUT CONTRAST
TECHNIQUE: Multiplanar, multisequence MR imaging of the lumbar spine was
performed. No intravenous contrast was administered.

[Series 6: T2 · sagittal · 4.0mm · 0.73mm/px · 7 of 15 slices shown (1 of 2)]
[im 1/15]
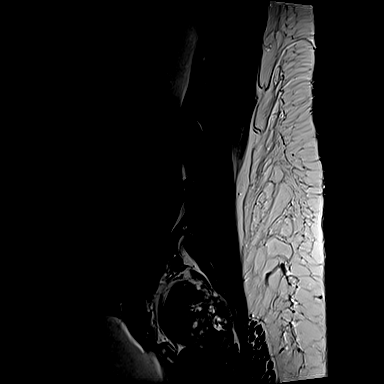
[im 3/15]
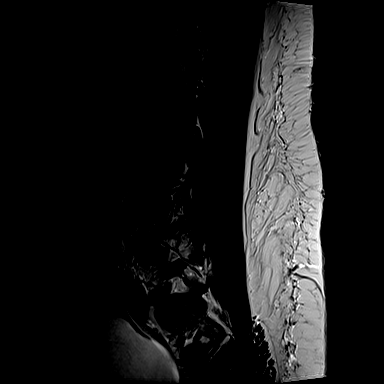
[im 5/15]
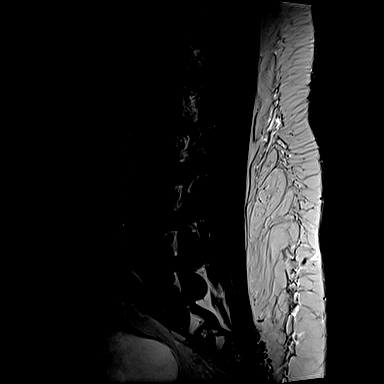
[im 8/15]
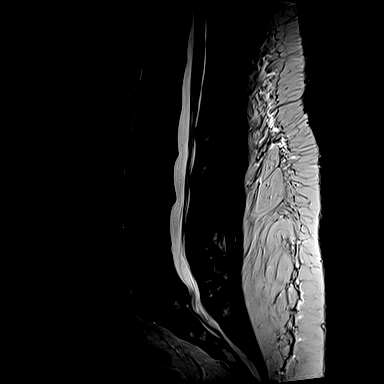
[im 10/15]
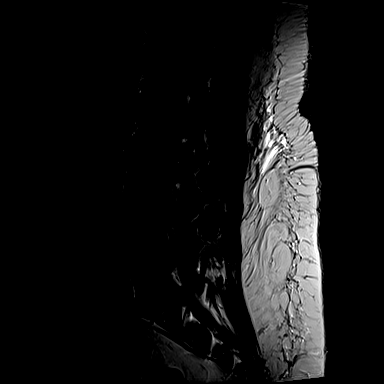
[im 12/15]
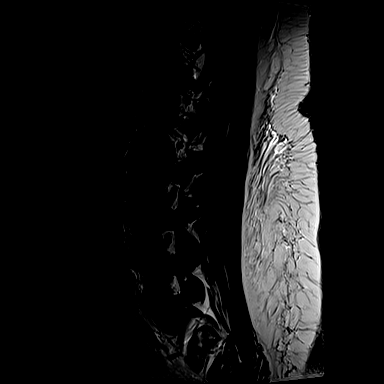
[im 15/15]
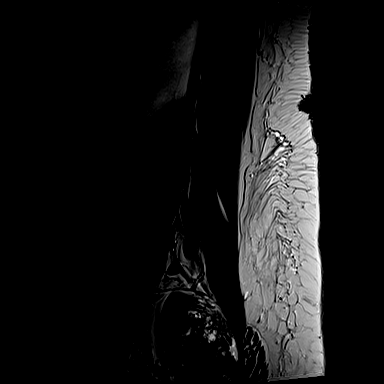

[Series 7: T1 · sagittal · 4.0mm · 0.73mm/px · 3 of 15 slices shown (1 of 2)]
[im 3/15]
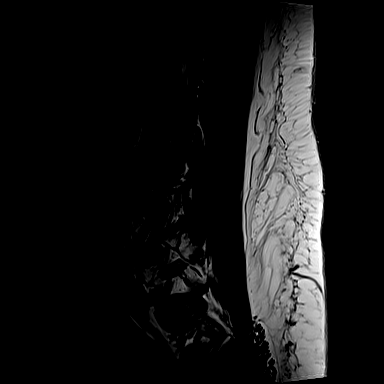
[im 8/15]
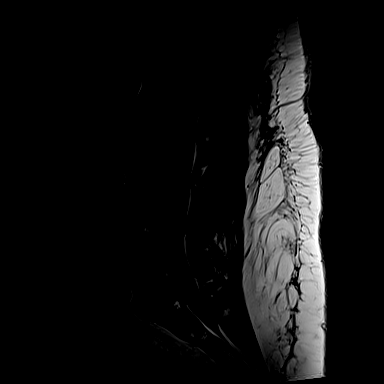
[im 12/15]
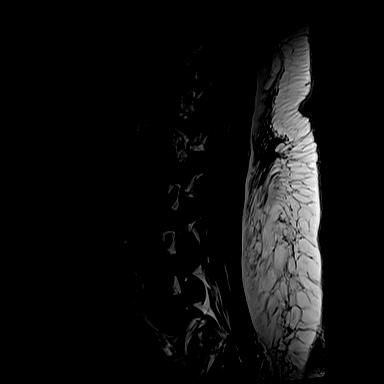

[Series 11: T1 · axial · 4.0mm · 0.28mm/px · z∈[-15,+117]mm · 3 of 33 slices shown (2 of 2)]
[im 5/33]
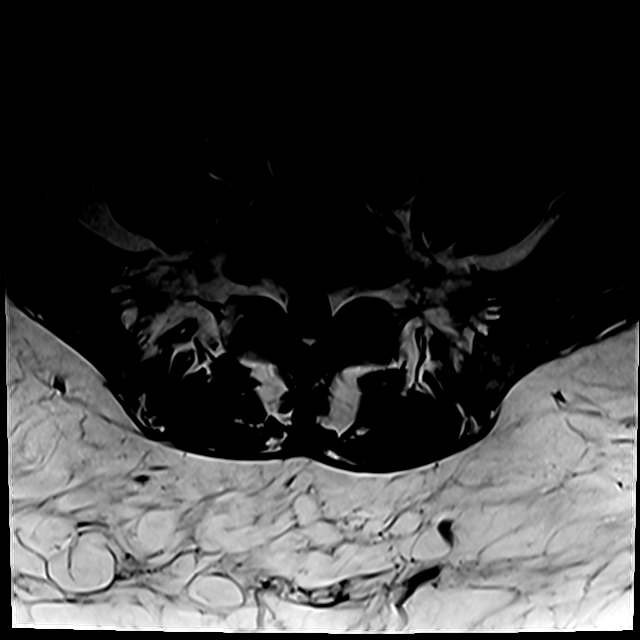
[im 18/33]
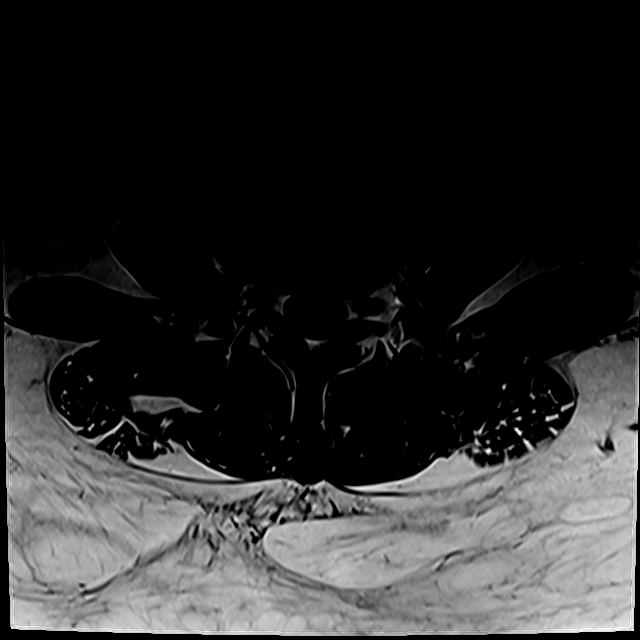
[im 28/33]
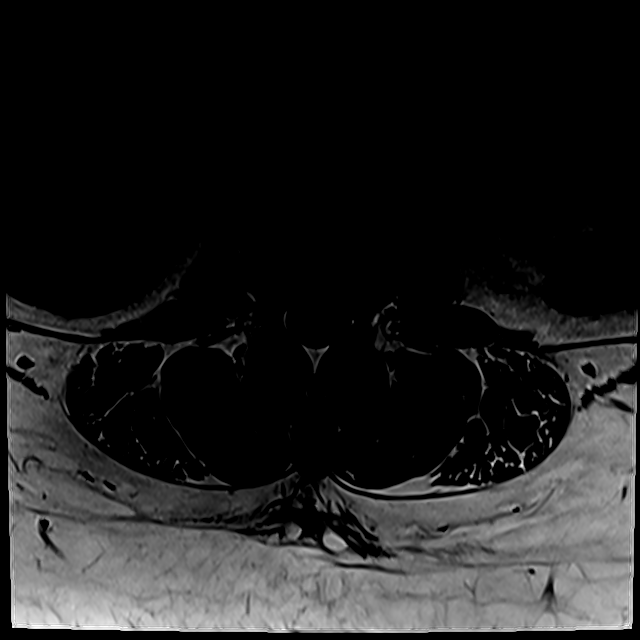

[Series 14: T2 · axial · 4.0mm · 0.28mm/px · z∈[-35,+117]mm · 6 of 33 slices shown (2 of 2)]
[im 1/33]
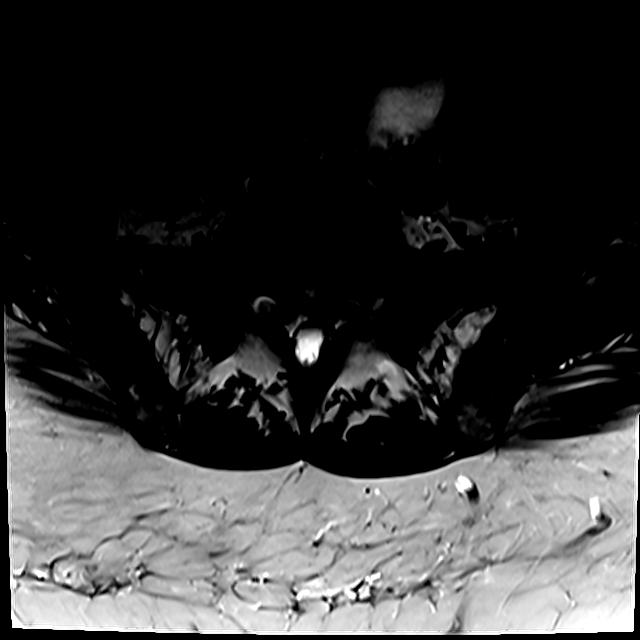
[im 5/33]
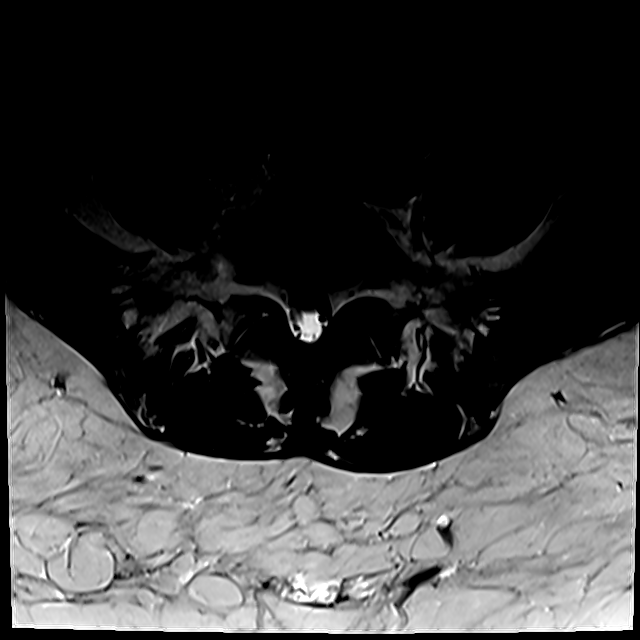
[im 10/33]
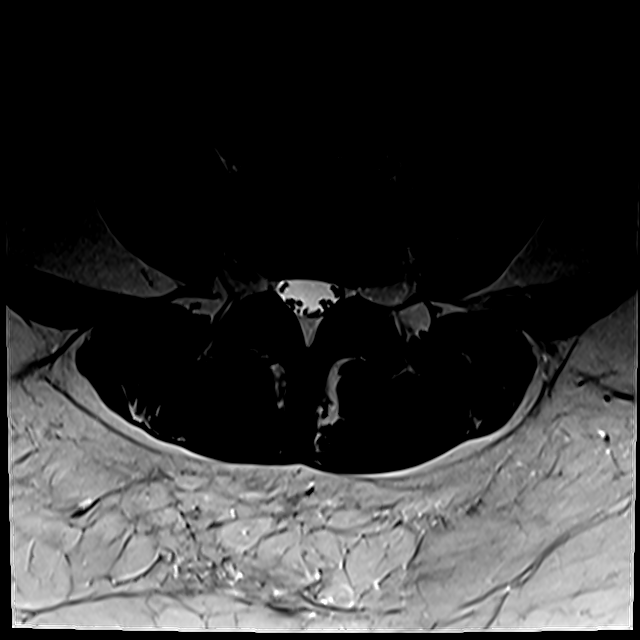
[im 15/33]
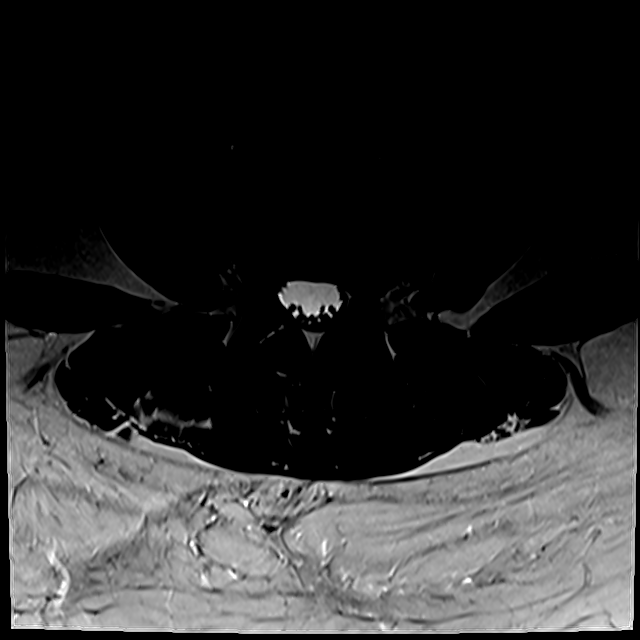
[im 18/33]
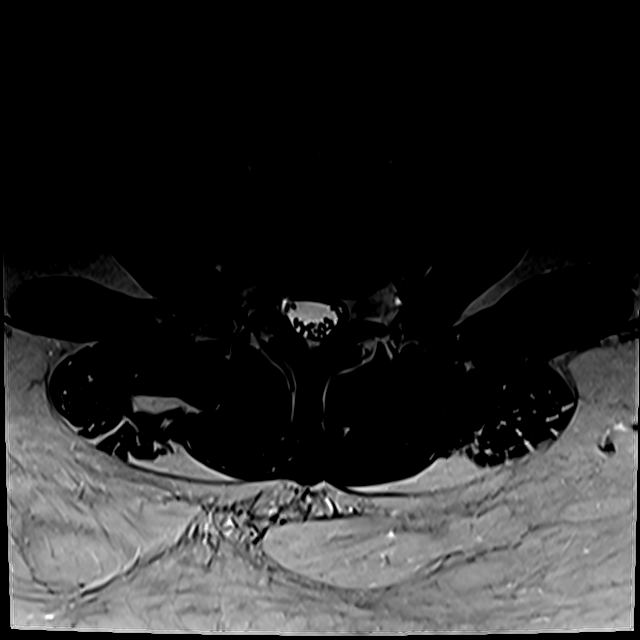
[im 28/33]
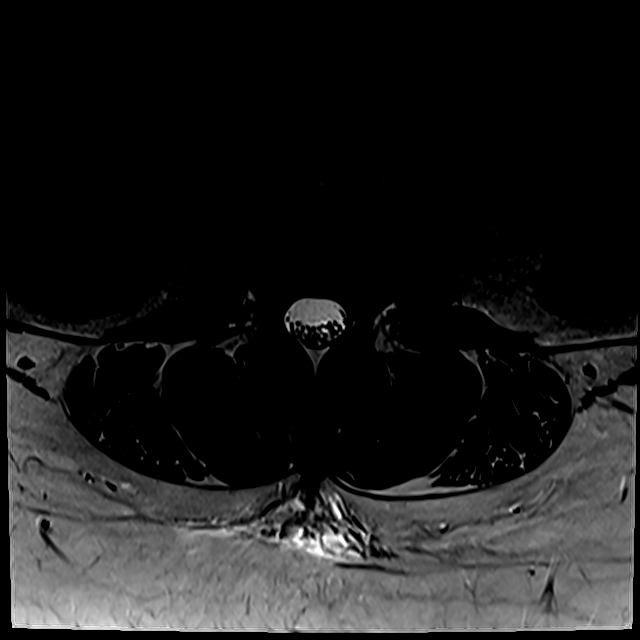

[19 of 48 positions shown; findings below may reference images not displayed]

FINDINGS: Segmentation:  Standard.

Alignment:  Physiologic.

Vertebrae:  No fracture, evidence of discitis, or bone lesion.

Conus medullaris: Extends to the L1-2 level and appears normal.

Paraspinal and other soft tissues: Negative.

Disc levels:

L1-2: No significant disc displacement, foraminal narrowing, or
canal stenosis.

L2-3: Minimal disc bulge. No significant foraminal narrowing or
canal stenosis.

L3-4: Minimal disc bulge. No significant foraminal narrowing or
canal stenosis.

L4-5: Small left foraminal and extraforaminal disc protrusion. No
significant foraminal stenosis or canal stenosis.

L5-S1: Right subarticular disc extrusion with inferior migration
measuring 8 x 11 x 14 mm (AP x ML x CC series 14, image 31 and
series 6, image 7). The disc extrusion impinges the descending right
S1 nerve root and moderately narrows the right S1 neural foramen.
Additionally there is a small diffuse disc bulge and mild facet
hypertrophy. No significant foraminal narrowing or canal stenosis.
IMPRESSION: 1. Right L5-S1 subarticular disc extrusion with inferior migration
measuring up to 14 mm with impingement of the right S1 nerve root in
the right lateral recess and moderate stenosis of the right S1
neural foramen.
2. Otherwise mild lumbar spondylosis without significant foraminal
narrowing or canal stenosis.

By: Rahema Mahseri M.D.

## 2020-02-16 ENCOUNTER — Telehealth: Payer: Self-pay | Admitting: Podiatry

## 2020-02-16 NOTE — Telephone Encounter (Signed)
lvm for patient to return call to get appointment scheduled, patients appointment has been cancelled due to possible inclement weather

## 2020-02-19 ENCOUNTER — Ambulatory Visit: Payer: 59 | Admitting: Podiatry

## 2020-03-13 ENCOUNTER — Other Ambulatory Visit: Payer: Self-pay | Admitting: Podiatry

## 2020-03-13 ENCOUNTER — Other Ambulatory Visit: Payer: Self-pay

## 2020-03-13 ENCOUNTER — Ambulatory Visit (INDEPENDENT_AMBULATORY_CARE_PROVIDER_SITE_OTHER): Payer: 59

## 2020-03-13 ENCOUNTER — Ambulatory Visit (INDEPENDENT_AMBULATORY_CARE_PROVIDER_SITE_OTHER): Payer: 59 | Admitting: Podiatry

## 2020-03-13 DIAGNOSIS — M722 Plantar fascial fibromatosis: Secondary | ICD-10-CM

## 2020-03-13 DIAGNOSIS — M79672 Pain in left foot: Secondary | ICD-10-CM

## 2020-03-13 NOTE — Progress Notes (Signed)
Subjective:  Patient ID: [name] Hughes, female    DOB: 11-11-1965,  MRN: [id number]  Chief Complaint  Patient presents with   Foot Pain    Left foot pain along the lateral side of the foot the pain is in the 4th and 5th toe. PT stated that she has numbness in the heel    55 y.o. female presents with the above complaint.  Patient presents with complaining of left heel pain.  Patient states that it has been going for quite some time and hurts on the lateral side of the foot as well.  Patient states that there is numbness present with it.  She has not seen anyone else prior to seeing me.  She denies any other acute complaints.  She has been trying to reschedule because of weather.  She would like to discuss treatment options for this.  She denies any other acute complaints.   Review of Systems: Negative except as noted in the HPI. Denies N/V/F/Ch.  Past Medical History:  Diagnosis Date   Arthritis    Chronic back pain    reason unknown but thinks related to knees   Headache(784.0)    HTN (hypertension)    takes Diovan HCT daily   Hypothyroid    takes Synthroid daily   Joint pain    Joint swelling     Current Outpatient Medications:    aspirin EC 325 MG tablet, Take 1 tablet (325 mg total) by mouth 2 (two) times daily after a meal. Take until 1 month post op to decrease risk of blood clots. (Patient not taking: Reported on 09/02/2014), Disp: 60 tablet, Rfl: 0   methocarbamol (ROBAXIN-750) 750 MG tablet, Take 1 tablet (750 mg total) by mouth every 8 (eight) hours as needed for muscle spasms. (Patient not taking: Reported on 09/02/2014), Disp: 40 tablet, Rfl: 0   oxyCODONE-acetaminophen (PERCOCET/ROXICET) 5-325 MG per tablet, Take 1-2 tablets by mouth every 6 (six) hours as needed for severe pain. (Patient not taking: Reported on 09/02/2014), Disp: 60 tablet, Rfl: 0   potassium chloride SA (K-DUR,KLOR-CON) 20 MEQ tablet, Take 1 tablet (20 mEq total) by mouth daily., Disp: 7  tablet, Rfl: 0   SYNTHROID 112 MCG tablet, Take 112 mcg by mouth daily before breakfast. , Disp: , Rfl:    valsartan-hydrochlorothiazide (DIOVAN-HCT) 320-25 MG per tablet, Take 1 tablet by mouth daily. , Disp: , Rfl:   Social History   Tobacco Use  Smoking Status Current Every Day Smoker   Packs/day: 0.50   Years: 10.00   Pack years: 5.00   Types: Cigarettes  Smokeless Tobacco Never Used    No Known Allergies Objective:  There were no vitals filed for this visit. There is no height or weight on file to calculate BMI. Constitutional Well developed. Well nourished.  Vascular Dorsalis pedis pulses palpable bilaterally. Posterior tibial pulses palpable bilaterally. Capillary refill normal to all digits.  No cyanosis or clubbing noted. Pedal hair growth normal.  Neurologic Normal speech. Oriented to person, place, and time. Epicritic sensation to light touch grossly present bilaterally.  Dermatologic Nails well groomed and normal in appearance. No open wounds. No skin lesions.  Orthopedic: Normal joint ROM without pain or crepitus bilaterally. No visible deformities. Tender to palpation at the calcaneal tuber left. No pain with calcaneal squeeze left. Ankle ROM diminished range of motion left. Silfverskiold Test: positive left.   Radiographs: Taken and reviewed. No acute fractures or dislocations. No evidence of stress fracture.  Plantar heel spur present.  Posterior heel spur present.   Assessment:   1. Left foot pain    Plan:  Patient was evaluated and treated and all questions answered.  Plantar Fasciitis, left - XR reviewed as above.  - Educated on icing and stretching. Instructions given.  - Injection delivered to the plantar fascia as below. - DME: Plantar Fascial Brace - Pharmacologic management: None  Procedure: Injection Tendon/Ligament Location: Left plantar fascia at the glabrous junction; medial approach. Skin Prep: alcohol Injectate: 0.5 cc 0.5%  marcaine plain, 0.5 cc of 1% Lidocaine, 0.5 cc kenalog 10. Disposition: Patient tolerated procedure well. Injection site dressed with a band-aid.  No follow-ups on file.

## 2020-03-14 ENCOUNTER — Encounter: Payer: Self-pay | Admitting: Podiatry

## 2020-03-18 ENCOUNTER — Ambulatory Visit: Payer: 59 | Admitting: Podiatry

## 2020-04-10 ENCOUNTER — Ambulatory Visit: Payer: 59 | Admitting: Podiatry

## 2020-04-19 ENCOUNTER — Ambulatory Visit: Payer: 59 | Admitting: Podiatry

## 2020-04-26 ENCOUNTER — Ambulatory Visit: Payer: 59 | Admitting: Podiatry

## 2020-10-24 DIAGNOSIS — M1990 Unspecified osteoarthritis, unspecified site: Secondary | ICD-10-CM | POA: Insufficient documentation

## 2020-11-13 DIAGNOSIS — M545 Low back pain, unspecified: Secondary | ICD-10-CM | POA: Insufficient documentation

## 2021-01-22 ENCOUNTER — Encounter (HOSPITAL_COMMUNITY): Payer: Self-pay

## 2021-01-22 ENCOUNTER — Emergency Department (HOSPITAL_COMMUNITY): Payer: 59

## 2021-01-22 ENCOUNTER — Emergency Department (HOSPITAL_COMMUNITY)
Admission: EM | Admit: 2021-01-22 | Discharge: 2021-01-22 | Disposition: A | Discharge status: Home / Self Care | Payer: 59 | Attending: Emergency Medicine | Admitting: Emergency Medicine

## 2021-01-22 ENCOUNTER — Other Ambulatory Visit: Payer: Self-pay

## 2021-01-22 DIAGNOSIS — J069 Acute upper respiratory infection, unspecified: Secondary | ICD-10-CM | POA: Diagnosis not present

## 2021-01-22 DIAGNOSIS — Z20822 Contact with and (suspected) exposure to covid-19: Secondary | ICD-10-CM | POA: Diagnosis not present

## 2021-01-22 DIAGNOSIS — F17210 Nicotine dependence, cigarettes, uncomplicated: Secondary | ICD-10-CM | POA: Insufficient documentation

## 2021-01-22 DIAGNOSIS — R059 Cough, unspecified: Secondary | ICD-10-CM | POA: Diagnosis present

## 2021-01-22 DIAGNOSIS — Z7982 Long term (current) use of aspirin: Secondary | ICD-10-CM | POA: Insufficient documentation

## 2021-01-22 DIAGNOSIS — E039 Hypothyroidism, unspecified: Secondary | ICD-10-CM | POA: Insufficient documentation

## 2021-01-22 DIAGNOSIS — I10 Essential (primary) hypertension: Secondary | ICD-10-CM | POA: Insufficient documentation

## 2021-01-22 DIAGNOSIS — Z79899 Other long term (current) drug therapy: Secondary | ICD-10-CM | POA: Diagnosis not present

## 2021-01-22 DIAGNOSIS — Z96652 Presence of left artificial knee joint: Secondary | ICD-10-CM | POA: Diagnosis not present

## 2021-01-22 LAB — CBC WITH DIFFERENTIAL/PLATELET
Abs Immature Granulocytes: 0.01 10*3/uL (ref 0.00–0.07)
Basophils Absolute: 0 10*3/uL (ref 0.0–0.1)
Basophils Relative: 0 %
Eosinophils Absolute: 0.1 10*3/uL (ref 0.0–0.5)
Eosinophils Relative: 1 %
HCT: 35.1 % — ABNORMAL LOW (ref 36.0–46.0)
Hemoglobin: 11.3 g/dL — ABNORMAL LOW (ref 12.0–15.0)
Immature Granulocytes: 0 %
Lymphocytes Relative: 32 %
Lymphs Abs: 1.8 10*3/uL (ref 0.7–4.0)
MCH: 29 pg (ref 26.0–34.0)
MCHC: 32.2 g/dL (ref 30.0–36.0)
MCV: 90.2 fL (ref 80.0–100.0)
Monocytes Absolute: 0.7 10*3/uL (ref 0.1–1.0)
Monocytes Relative: 12 %
Neutro Abs: 2.9 10*3/uL (ref 1.7–7.7)
Neutrophils Relative %: 55 %
Platelets: 237 10*3/uL (ref 150–400)
RBC: 3.89 MIL/uL (ref 3.87–5.11)
RDW: 14.7 % (ref 11.5–15.5)
WBC: 5.5 10*3/uL (ref 4.0–10.5)
nRBC: 0 % (ref 0.0–0.2)

## 2021-01-22 LAB — COMPREHENSIVE METABOLIC PANEL
ALT: 21 U/L (ref 0–44)
AST: 26 U/L (ref 15–41)
Albumin: 3.4 g/dL — ABNORMAL LOW (ref 3.5–5.0)
Alkaline Phosphatase: 99 U/L (ref 38–126)
Anion gap: 7 (ref 5–15)
BUN: 8 mg/dL (ref 6–20)
CO2: 26 mmol/L (ref 22–32)
Calcium: 8.5 mg/dL — ABNORMAL LOW (ref 8.9–10.3)
Chloride: 105 mmol/L (ref 98–111)
Creatinine, Ser: 0.81 mg/dL (ref 0.44–1.00)
GFR, Estimated: >60 mL/min (ref >60)
Glucose, Bld: 107 mg/dL — ABNORMAL HIGH (ref 70–99)
Potassium: 3.4 mmol/L — ABNORMAL LOW (ref 3.5–5.1)
Sodium: 138 mmol/L (ref 135–145)
Total Bilirubin: 0.6 mg/dL (ref 0.3–1.2)
Total Protein: 7.1 g/dL (ref 6.5–8.1)

## 2021-01-22 LAB — RESP PANEL BY RT-PCR (FLU A&B, COVID) ARPGX2
Influenza A by PCR: NEGATIVE
Influenza B by PCR: NEGATIVE
SARS Coronavirus 2 by RT PCR: NEGATIVE

## 2021-01-22 NOTE — ED Triage Notes (Addendum)
Pt reports with cough, congestion, and SHOB since Sunday. Pt states that she did an E-Visit but the medications are not working.

## 2021-01-22 NOTE — Discharge Instructions (Signed)
You are seen in the ER today for your congestion and cough.  Physical exam and vital signs were very reassuring with your blood work and chest x-ray.  You tested negative for COVID and influenza but you likely have another acute viral upper respiratory infection.  You may continue to take the albuterol you are prescribed as needed and may use over-the-counter decongestions.  Follow-up with your primary care doctor and return to the ER with any chest pain, difficulty breathing, nausea or vomiting that does not stop, or any other new severe symptoms

## 2021-01-22 NOTE — ED Provider Notes (Signed)
[name]-EMERGENCY DEPT Provider Note   CSN: [id number] Arrival date & time: 01/22/21  0225     History Chief Complaint  Patient presents with   Cough   Nasal Congestion   Shortness of Breath    [name] Hughes is a 55 y.o. female who presents with 72 hours of congestion, cough productive with yellow phlegm, chills, fatigue, and chest tightness without chest pain or palpitations.  She has been vaccinated for COVID-19 and denies known sick contacts.  She denies any abdominal pain nausea, vomiting, documented fevers, or headaches.  I have personally reviewed her medical records.  She has history of hypertension on valsartan HCTZ and hypothyroidism on Synthroid.  Additionally she is taking medication for weight loss, from weight management provider.   HPI     Past Medical History:  Diagnosis Date   Arthritis    Chronic back pain    reason unknown but thinks related to knees   Headache(784.0)    HTN (hypertension)    takes Diovan HCT daily   Hypothyroid    takes Synthroid daily   Joint pain    Joint swelling     Patient Active Problem List   Diagnosis Date Noted   Severe obesity (BMI >= 40) (HCC) 10/07/2013   Osteoarthritis of left knee 10/06/2013   Essential hypertension 07/21/2013   Chest pain 07/21/2013    Past Surgical History:  Procedure Laterality Date   ABDOMINAL HYSTERECTOMY     CESAREAN SECTION     GALLBLADDER SURGERY     KNEE ARTHROSCOPY Bilateral    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     Family History  Problem Relation Age of Onset   Hyperlipidemia Father    Heart attack Brother    Stroke Paternal Grandmother     Social History   Tobacco Use   Smoking status: Every Day    Packs/day:  0.50    Years: 10.00    Pack years: 5.00    Types: Cigarettes   Smokeless tobacco: Never  Substance Use Topics   Alcohol use: Yes    Comment: occasionally   Drug use: No    Home Medications Prior to Admission medications   Medication Sig Start Date End Date Taking? Authorizing Provider  aspirin EC 325 MG tablet Take 1 tablet (325 mg total) by mouth 2 (two) times daily after a meal. Take until 1 month post op to decrease risk of blood clots. Patient not taking: Reported on 09/02/2014 10/07/13   Marshia Ly, PA-C  methocarbamol (ROBAXIN-750) 750 MG tablet Take 1 tablet (750 mg total) by mouth every 8 (eight) hours as needed for muscle spasms. Patient not taking: Reported on 09/02/2014 10/06/13   Marshia Ly, PA-C  oxyCODONE-acetaminophen (PERCOCET/ROXICET) 5-325 MG per tablet Take 1-2 tablets by mouth every 6 (six) hours as needed for severe pain. Patient not taking: Reported on 09/02/2014 10/06/13   Marshia Ly, PA-C  potassium chloride SA (K-DUR,KLOR-CON) 20 MEQ tablet Take 1 tablet (20 mEq total) by mouth daily. 09/04/14   Ethelda Chick, MD  SYNTHROID 112 MCG tablet Take 112 mcg by mouth daily before breakfast.  07/18/13   [provider]  valsartan-hydrochlorothiazide (DIOVAN-HCT) 320-25 MG per tablet Take 1  tablet by mouth daily.  07/14/13   [provider]    Allergies    Patient has no known allergies.  Review of Systems   Review of Systems  Constitutional:  Positive for appetite change, chills and fatigue. Negative for activity change and fever.  HENT:  Positive for congestion and rhinorrhea. Negative for ear discharge, facial swelling and sore throat.   Eyes: Negative.   Respiratory:  Positive for cough and chest tightness. Negative for shortness of breath.   Cardiovascular: Negative.  Negative for chest pain, palpitations and leg swelling.  Gastrointestinal: Negative.  Negative for abdominal distention.  Genitourinary: Negative.   Musculoskeletal:  Negative.   Neurological: Negative.   Hematological: Negative.   Psychiatric/Behavioral: Negative.     Physical Exam Updated Vital Signs BP 116/88    Pulse (!) 57    Temp 98.7 F (37.1 C) (Oral)    Resp 18    Ht 5\' 6"  (1.676 m)    Wt 108 kg    SpO2 98%    BMI 38.41 kg/m   Physical Exam Vitals and nursing note reviewed.  Constitutional:      Appearance: She is not ill-appearing or toxic-appearing.  HENT:     Head: Normocephalic and atraumatic.     Nose: Congestion present.     Mouth/Throat:     Mouth: Mucous membranes are moist.     Pharynx: Oropharynx is clear. Uvula midline. No oropharyngeal exudate or posterior oropharyngeal erythema.     Tonsils: No tonsillar exudate.  Eyes:     General: Lids are normal. Vision grossly intact.        Right eye: No discharge.        Left eye: No discharge.     Conjunctiva/sclera: Conjunctivae normal.  Neck:     Trachea: Trachea and phonation normal.  Cardiovascular:     Rate and Rhythm: Normal rate and regular rhythm.     Pulses: Normal pulses.     Heart sounds: Normal heart sounds. No murmur heard. Pulmonary:     Effort: Pulmonary effort is normal. No tachypnea, bradypnea, accessory muscle usage, prolonged expiration or respiratory distress.     Breath sounds: Normal breath sounds. No wheezing or rales.  Chest:     Chest wall: No mass, tenderness or edema.  Abdominal:     General: Bowel sounds are normal. There is no distension.     Palpations: Abdomen is soft.     Tenderness: There is no abdominal tenderness.  Musculoskeletal:        General: No deformity.     Cervical back: Neck supple.     Right lower leg: No edema.     Left lower leg: No edema.  Lymphadenopathy:     Cervical: Cervical adenopathy present.     Right cervical: Superficial cervical adenopathy present.     Left cervical: Superficial cervical adenopathy present.  Skin:    General: Skin is warm and dry.     Capillary Refill: Capillary refill takes less than 2  seconds.  Neurological:     General: No focal deficit present.     Mental Status: She is alert and oriented to person, place, and time. Mental status is at baseline.     Gait: Gait is intact. Gait normal.  Psychiatric:        Mood and Affect: Mood normal.    ED Results / Procedures / Treatments   Labs (all labs ordered are listed, but only abnormal results are displayed) Labs Reviewed  CBC WITH DIFFERENTIAL/PLATELET - Abnormal; Notable for the following components:      Result Value   Hemoglobin 11.3 (*)    HCT 35.1 (*)    All other components within normal limits  COMPREHENSIVE METABOLIC PANEL - Abnormal; Notable for the following components:   Potassium 3.4 (*)    Glucose, Bld 107 (*)    Calcium 8.5 (*)    Albumin 3.4 (*)    All other components within normal limits  RESP PANEL BY RT-PCR (FLU A&B, COVID) ARPGX2    EKG None  Radiology DG Chest Portable 1 View  Result Date: 01/22/2021 CLINICAL DATA:  Shortness of breath. EXAM: PORTABLE CHEST 1 VIEW COMPARISON:  11/19/2014. FINDINGS: The heart size and mediastinal contours are within normal limits. Both lungs are clear. Degenerative changes are present in the thoracic spine. IMPRESSION: No acute cardiopulmonary process. Electronically Signed   By: Thornell Sartorius M.D.   On: 01/22/2021 02:54    Procedures Procedures   Medications Ordered in ED Medications - No data to display  ED Course  I have reviewed the triage vital signs and the nursing notes.  Pertinent labs & imaging results that were available during my care of the patient were reviewed by me and considered in my medical decision making (see chart for details).    MDM Rules/Calculators/A&P                         55 year old female presents with 3 days of URI symptoms.  Mildly hypertensive on intake, vital signs otherwise normal.  Cardiopulmonary exam is normal, abdominal exam is benign.  HEENT exam revealed congestion, clear rhinorrhea, and some shoddy  cervical lymphadenopathy.  Patient is well-appearing, very pleasant, and energetic.  Basic labs are obtained from triage.  CBC with mild anemia with hemoglobin 11.3 near patient's baseline, CMP with mild hypokalemia of 3.4, otherwise unremarkable.  Respiratory pathogen panel is negative.  Chest x-ray negative for acute cardiopulmonary disease and EKG is reassuring with normal sinus rhythm without ischemic changes.  Patient states that she was primarily anxious husband who presented emergency department.  Vital signs as well as physical exam no further work-up is warranted in the ER at this time.  Patient likely has an acute viral URI, she may continue to use the albuterol that was prescribed to her in the outpatient setting, follow-up with her PCP and return to the ER with any severe symptoms.  Carin voiced understanding of her medical evaluation and treatment plan.  Each of her questions was answered to her expressed satisfaction.  Return precautions were given.  Patient is well-appearing, stable, and appropriate for discharge at this time.   This chart was dictated using voice recognition software, Dragon. Despite the best efforts of this provider to proofread and correct errors, errors may still occur which can change documentation meaning.    Final Clinical Impression(s) / ED Diagnoses Final diagnoses:  Upper respiratory tract infection, unspecified type    Rx / DC Orders ED Discharge Orders     None        Sherrilee Gilles 01/22/21 0414    Nira Conn, MD 01/22/21 [phone]

## 2021-06-27 ENCOUNTER — Ambulatory Visit: Payer: 59 | Admitting: Family

## 2021-07-03 ENCOUNTER — Ambulatory Visit (INDEPENDENT_AMBULATORY_CARE_PROVIDER_SITE_OTHER): Payer: 59 | Admitting: Family

## 2021-07-03 ENCOUNTER — Encounter: Payer: Self-pay | Admitting: Family

## 2021-07-03 VITALS — BP 128/80 | HR 70 | Temp 98.6°F | Resp 16 | Ht 66.0 in | Wt 291.3 lb

## 2021-07-03 DIAGNOSIS — M0579 Rheumatoid arthritis with rheumatoid factor of multiple sites without organ or systems involvement: Secondary | ICD-10-CM | POA: Diagnosis not present

## 2021-07-03 DIAGNOSIS — D649 Anemia, unspecified: Secondary | ICD-10-CM | POA: Insufficient documentation

## 2021-07-03 DIAGNOSIS — E876 Hypokalemia: Secondary | ICD-10-CM | POA: Diagnosis not present

## 2021-07-03 DIAGNOSIS — I10 Essential (primary) hypertension: Secondary | ICD-10-CM

## 2021-07-03 DIAGNOSIS — E039 Hypothyroidism, unspecified: Secondary | ICD-10-CM | POA: Insufficient documentation

## 2021-07-03 DIAGNOSIS — E041 Nontoxic single thyroid nodule: Secondary | ICD-10-CM | POA: Insufficient documentation

## 2021-07-03 DIAGNOSIS — M353 Polymyalgia rheumatica: Secondary | ICD-10-CM | POA: Diagnosis not present

## 2021-07-03 DIAGNOSIS — Z720 Tobacco use: Secondary | ICD-10-CM

## 2021-07-03 DIAGNOSIS — E7800 Pure hypercholesterolemia, unspecified: Secondary | ICD-10-CM

## 2021-07-03 DIAGNOSIS — E559 Vitamin D deficiency, unspecified: Secondary | ICD-10-CM

## 2021-07-03 NOTE — Assessment & Plan Note (Signed)
Ordering thyroid u/s pt to call to schedule

## 2021-07-03 NOTE — Assessment & Plan Note (Signed)
Lipid panel ordered pending results. Work on low chol diet exercise as tolerated  

## 2021-07-03 NOTE — Assessment & Plan Note (Signed)
Order cbc pending results. 

## 2021-07-03 NOTE — Assessment & Plan Note (Signed)
Smoking cessation instruction/counseling given:  counseled patient on the dangers of tobacco use, advised patient to stop smoking, and reviewed strategies to maximize success 

## 2021-07-03 NOTE — Assessment & Plan Note (Signed)
Order cmp pending results 

## 2021-07-03 NOTE — Assessment & Plan Note (Signed)
Cont f/u with rheumatologist as scheduled.

## 2021-07-03 NOTE — Assessment & Plan Note (Signed)
Continue levothyroxine 112 mcg once daily Order tsh pending results

## 2021-07-03 NOTE — Assessment & Plan Note (Signed)
Ordered vitamin d pending results.   

## 2021-07-03 NOTE — Assessment & Plan Note (Signed)
Cont methotrexate and folic acid Cont f/u with rheumatologist as scheduled.

## 2021-07-03 NOTE — Assessment & Plan Note (Signed)
Work on diet and exercise 

## 2021-07-03 NOTE — Assessment & Plan Note (Signed)
Continue diovan HCT  Consider hctz with low potassium, once repeated if still low may consider d/c  Pt advised of the following:  Continue medication as prescribed. Monitor blood pressure periodically and/or when you feel symptomatic. Goal is <130/90 on average. Ensure that you have rested for 30 minutes prior to checking your blood pressure. Record your readings and bring them to your next visit if necessary.work on a low sodium diet.

## 2021-07-03 NOTE — Progress Notes (Signed)
New Patient Office Visit  Subjective:  Patient ID: [name] Hughes, female    DOB: [birth year]  Age: 56 y.o. MRN: [id number]  CC:  Chief Complaint  Patient presents with   Establish Care    HPI [name] Hughes is here to establish care as a new patient.  Prior provider was: Dr. Renne CriglerPharr  Pt is without acute concerns.   [facility]   chronic concerns:  Polymyalgia rheumatica as well as RA: pt sees rheumatology every three months or so. She is on methotrexate as well as folic acid.   Hypothyroid: stable, on levothyroxine 112 mcg once daily.   HTN: valsartan hctz 320-25 mg once daily. Today blood pressure 128/80.   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/06/2013   dr graves   TOTAL KNEE ARTHROPLASTY Left 10/06/2013   Procedure: LEFT TOTAL KNEE ARTHROPLASTY;  Surgeon: Harvie JuniorJohn L Graves, MD;  Location: [facility] OR;  Service: Orthopedics;  Laterality: Left;    Family History  Problem Relation Age of Onset   Hyperlipidemia Father    Alzheimer's disease Father    Glaucoma Father    Other Brother        suicide, overdose   Stroke Paternal Grandmother    Breast cancer Maternal Aunt    Breast cancer Paternal Aunt     Social History   Socioeconomic History   Marital status: Married    Spouse name: Not on file   Number of children: 1   Years of education: Not on file   Highest education level: Not on file  Occupational History    Employer: [employer]  Tobacco Use   Smoking status: Every Day    Packs/day: 0.50    Years: 20.00    Pack years:  10.00    Types: Cigarettes   Smokeless tobacco: Never  Vaping Use   Vaping Use: Never used  Substance and Sexual Activity   Alcohol use: Yes    Comment: occasionally   Drug use: No   Sexual activity: Yes    Partners: Male    Birth control/protection: Surgical  Other Topics Concern   Not on file  Social History Narrative   Daughter age 56    Social Determinants of Health   Financial Resource Strain: Not on file  Food Insecurity: Not on file  Transportation Needs: Not on file  Physical Activity: Not on file  Stress: Not on file  Social Connections: Not on file  Intimate Partner Violence: Not on file    Outpatient Medications Prior to Visit  Medication Sig Dispense Refill   folic acid (FOLVITE) 0.5 MG tablet Take 0.5 mg by mouth daily.     gabapentin (NEURONTIN) 300 MG capsule Take 300 mg by mouth at bedtime.     methotrexate (RHEUMATREX) 2.5 MG tablet TAKE 8 TABLETS BY MOUTH ONCE WEEKLY( TAKE ALL AT ONCE)     SYNTHROID 112 MCG tablet Take 112 mcg by mouth daily  before breakfast.      valsartan-hydrochlorothiazide (DIOVAN-HCT) 320-25 MG per tablet Take 1 tablet by mouth daily.      levothyroxine (SYNTHROID) 112 MCG tablet 1 tablet in the morning on an empty stomach     aspirin EC 325 MG tablet Take 1 tablet (325 mg total) by mouth 2 (two) times daily after a meal. Take until 1 month post op to decrease risk of blood clots. (Patient not taking: Reported on 09/02/2014) 60 tablet 0   methocarbamol (ROBAXIN-750) 750 MG tablet Take 1 tablet (750 mg total) by mouth every 8 (eight) hours as needed for muscle spasms. (Patient not taking: Reported on 09/02/2014) 40 tablet 0   oxyCODONE-acetaminophen (PERCOCET/ROXICET) 5-325 MG per tablet Take 1-2 tablets by mouth every 6 (six) hours as needed for severe pain. (Patient not taking: Reported on 09/02/2014) 60 tablet 0   potassium chloride SA (K-DUR,KLOR-CON) 20 MEQ tablet Take 1 tablet (20 mEq total) by mouth daily. (Patient not taking: Reported  on 07/03/2021) 7 tablet 0   No facility-administered medications prior to visit.    No Known Allergies      Objective:    Physical Exam  Gen: NAD, resting comfortably CV: RRR with no murmurs appreciated Pulm: NWOB, CTAB with no crackles, wheezes, or rhonchi Skin: warm, dry Psych: Normal affect and thought content  BP 128/80   Pulse 70   Temp 98.6 F (37 C)   Resp 16   Ht 5\' 6"  (1.676 m)   Wt 291 lb 5 oz (132.1 kg)   SpO2 97%   BMI 47.02 kg/m  Wt Readings from Last 3 Encounters:  07/03/21 291 lb 5 oz (132.1 kg)  01/22/21 238 lb (108 kg)  09/02/14 266 lb 6.4 oz (120.8 kg)     Health Maintenance Due  Topic Date Due   COVID-19 Vaccine (1) Never done   HIV Screening  Never done   Hepatitis C Screening  Never done   TETANUS/TDAP  Never done   Zoster Vaccines- Shingrix (1 of 2) Never done   PAP SMEAR-Modifier  Never done   COLONOSCOPY (Pts 45-73yrs Insurance coverage will need to be confirmed)  Never done   MAMMOGRAM  Never done    There are no preventive care reminders to display for this patient.  No results found for: TSH Lab Results  Component Value Date   WBC 5.5 01/22/2021   HGB 11.3 (L) 01/22/2021   HCT 35.1 (L) 01/22/2021   MCV 90.2 01/22/2021   PLT 237 01/22/2021   Lab Results  Component Value Date   NA 138 01/22/2021   K 3.4 (L) 01/22/2021   CO2 26 01/22/2021   GLUCOSE 107 (H) 01/22/2021   BUN 8 01/22/2021   CREATININE 0.81 01/22/2021   BILITOT 0.6 01/22/2021   ALKPHOS 99 01/22/2021   AST 26 01/22/2021   ALT 21 01/22/2021   PROT 7.1 01/22/2021   ALBUMIN 3.4 (L) 01/22/2021   CALCIUM 8.5 (L) 01/22/2021   ANIONGAP 7 01/22/2021   No results found for: CHOL No results found for: HDL No results found for: LDLCALC No results found for: TRIG No results found for: CHOLHDL No results found for: 01/24/2021    Assessment & Plan:   Problem List Items Addressed This Visit       Cardiovascular and Mediastinum   Essential hypertension     Continue diovan HCT  Consider hctz with low potassium, once repeated if still low may consider d/c  Pt advised of the following:  Continue medication as prescribed. Monitor blood pressure periodically and/or when you feel symptomatic. Goal is <130/90 on average. Ensure that you have rested for 30 minutes prior to checking your blood pressure. Record your readings and bring them to your next visit if necessary.work on a low sodium diet.         Endocrine   Acquired hypothyroidism    Continue levothyroxine 112 mcg once daily Order tsh pending results       Relevant Orders   US THYROID   TSH   Right thyroid nodule    Ordering thyroid u/s pt to call to schedule       Relevant Orders   US THYROID     Musculoskeletal and Integument   Rheumatoid arthritis involving multiple sites with positive rheumatoid factor (HCC)    Cont methotrexate and folic acid Cont f/u with rheumatologist as scheduled.       Relevant Medications   methotrexate (RHEUMATREX) 2.5 MG tablet     Other   Severe obesity (BMI >= 40) (HCC) (Chronic)    Work on diet and exercise       Polymyalgia rheumatica (HCC) - Primary    Cont f/u with rheumatologist as scheduled.      Hypokalemia    Order cmp pending results       Relevant Orders   Comprehensive metabolic panel   Hypercholesteremia    Lipid panel ordered pending results.  Work on [employer] exercise as tolerated       Relevant Orders   Comprehensive metabolic panel   Lipid panel   Anemia    Order cbc pending results       Relevant Medications   folic acid (FOLVITE) 0.5 MG tablet   Other Relevant Orders   CBC with Differential/Platelet   Vitamin D deficiency    Ordered vitamin d pending results.         Relevant Orders   VITAMIN D 25 Hydroxy (Vit-D Deficiency, Fractures)   Tobacco abuse    Smoking cessation instruction/counseling given:  counseled patient on the dangers of tobacco use, advised patient to stop smoking, and  reviewed strategies to maximize success        Relevant Orders   Ambulatory Referral Lung Cancer Screening [facility]    No orders of the defined types were placed in this encounter.   Follow-up: Return in about 6 months (around 01/02/2022) for regular follow up and labs .    Mort Sawyers, FNP

## 2021-07-03 NOTE — Patient Instructions (Signed)
  Your imaging for thyroid ultrasound  Please call to schedule appointment.   [facility] outpatient imaging center [address], [place] [address] Phone [phone]-  8-5 pm   Welcome to our clinic, I am happy to have you as my new patient. I am excited to continue on this healthcare journey with you.  Stop by the lab prior to leaving today. I will notify you of your results once received.   Please keep in mind Any my chart messages you send have up to a three business day turnaround for a response.  Phone calls may take up to a one full business day turnaround for a  response.   If you need a medication refill I recommend you request it through the pharmacy as this is easiest for [place] rather than sending a message and or phone call.   Due to recent changes in healthcare laws, you may see results of your imaging and/or laboratory studies on MyChart before I have had a chance to review them.  I understand that in some cases there may be results that are confusing or concerning to you. Please understand that not all results are received at the same time and often I may need to interpret multiple results in order to provide you with the best plan of care or course of treatment. Therefore, I ask that you please give me 2 business days to thoroughly review all your results before contacting my office for clarification. Should we see a critical lab result, you will be contacted sooner.   It was a pleasure seeing you today! Please do not hesitate to reach out with any questions and or concerns.  Regards,   Eugenia Pancoast FNP-C

## 2021-07-04 LAB — CBC WITH DIFFERENTIAL/PLATELET
Absolute Monocytes: 639 cells/uL (ref 200–950)
Basophils Absolute: 36 cells/uL (ref 0–200)
Basophils Relative: 0.4 %
Eosinophils Absolute: 279 cells/uL (ref 15–500)
Eosinophils Relative: 3.1 %
HCT: 33.8 % — ABNORMAL LOW (ref 35.0–45.0)
Hemoglobin: 11 g/dL — ABNORMAL LOW (ref 11.7–15.5)
Lymphs Abs: 2079 cells/uL (ref 850–3900)
MCH: 29.3 pg (ref 27.0–33.0)
MCHC: 32.5 g/dL (ref 32.0–36.0)
MCV: 90.1 fL (ref 80.0–100.0)
MPV: 12 fL (ref 7.5–12.5)
Monocytes Relative: 7.1 %
Neutro Abs: 5967 cells/uL (ref 1500–7800)
Neutrophils Relative %: 66.3 %
Platelets: 257 10*3/uL (ref 140–400)
RBC: 3.75 10*6/uL — ABNORMAL LOW (ref 3.80–5.10)
RDW: 13.7 % (ref 11.0–15.0)
Total Lymphocyte: 23.1 %
WBC: 9 10*3/uL (ref 3.8–10.8)

## 2021-07-04 LAB — COMPREHENSIVE METABOLIC PANEL
AG Ratio: 1.3 (calc) (ref 1.0–2.5)
ALT: 13 U/L (ref 6–29)
AST: 15 U/L (ref 10–35)
Albumin: 3.3 g/dL — ABNORMAL LOW (ref 3.6–5.1)
Alkaline phosphatase (APISO): 104 U/L (ref 37–153)
BUN: 12 mg/dL (ref 7–25)
CO2: 27 mmol/L (ref 20–32)
Calcium: 9.1 mg/dL (ref 8.6–10.4)
Chloride: 107 mmol/L (ref 98–110)
Creat: 0.82 mg/dL (ref 0.50–1.03)
Globulin: 2.6 g/dL (calc) (ref 1.9–3.7)
Glucose, Bld: 85 mg/dL (ref 65–99)
Potassium: 3.6 mmol/L (ref 3.5–5.3)
Sodium: 144 mmol/L (ref 135–146)
Total Bilirubin: 0.4 mg/dL (ref 0.2–1.2)
Total Protein: 5.9 g/dL — ABNORMAL LOW (ref 6.1–8.1)

## 2021-07-04 LAB — VITAMIN D 25 HYDROXY (VIT D DEFICIENCY, FRACTURES): Vit D, 25-Hydroxy: 35 ng/mL (ref 30–100)

## 2021-07-04 LAB — LIPID PANEL
Cholesterol: 177 mg/dL (ref <200)
HDL: 47 mg/dL — ABNORMAL LOW (ref >=50)
LDL Cholesterol (Calc): 115 mg/dL (calc) — ABNORMAL HIGH
Non-HDL Cholesterol (Calc): 130 mg/dL (calc) — ABNORMAL HIGH (ref <130)
Total CHOL/HDL Ratio: 3.8 (calc) (ref <5.0)
Triglycerides: 59 mg/dL (ref <150)

## 2021-07-04 LAB — TSH: TSH: 1.24 mIU/L (ref 0.40–4.50)

## 2021-07-23 ENCOUNTER — Encounter: Payer: Self-pay | Admitting: *Deleted

## 2021-08-07 ENCOUNTER — Ambulatory Visit: Payer: 59

## 2021-08-12 ENCOUNTER — Ambulatory Visit
Admission: RE | Admit: 2021-08-12 | Discharge: 2021-08-12 | Disposition: A | Discharge status: Home / Self Care | Payer: 59 | Source: Ambulatory Visit | Attending: Family | Admitting: Family

## 2021-08-12 DIAGNOSIS — E041 Nontoxic single thyroid nodule: Secondary | ICD-10-CM | POA: Insufficient documentation

## 2021-08-12 DIAGNOSIS — E039 Hypothyroidism, unspecified: Secondary | ICD-10-CM | POA: Insufficient documentation

## 2021-09-24 DIAGNOSIS — Z0289 Encounter for other administrative examinations: Secondary | ICD-10-CM

## 2021-10-01 ENCOUNTER — Encounter (INDEPENDENT_AMBULATORY_CARE_PROVIDER_SITE_OTHER): Payer: Self-pay | Admitting: Family Medicine

## 2021-10-01 ENCOUNTER — Ambulatory Visit (INDEPENDENT_AMBULATORY_CARE_PROVIDER_SITE_OTHER): Payer: 59 | Admitting: Family Medicine

## 2021-10-01 VITALS — BP 109/69 | HR 61 | Temp 98.4°F | Ht 66.0 in | Wt 283.0 lb

## 2021-10-01 DIAGNOSIS — E559 Vitamin D deficiency, unspecified: Secondary | ICD-10-CM

## 2021-10-01 DIAGNOSIS — E7849 Other hyperlipidemia: Secondary | ICD-10-CM | POA: Diagnosis not present

## 2021-10-01 DIAGNOSIS — M1711 Unilateral primary osteoarthritis, right knee: Secondary | ICD-10-CM | POA: Diagnosis not present

## 2021-10-01 DIAGNOSIS — D649 Anemia, unspecified: Secondary | ICD-10-CM

## 2021-10-01 DIAGNOSIS — R0602 Shortness of breath: Secondary | ICD-10-CM

## 2021-10-01 DIAGNOSIS — M353 Polymyalgia rheumatica: Secondary | ICD-10-CM

## 2021-10-01 DIAGNOSIS — Z1331 Encounter for screening for depression: Secondary | ICD-10-CM

## 2021-10-01 DIAGNOSIS — E039 Hypothyroidism, unspecified: Secondary | ICD-10-CM

## 2021-10-01 DIAGNOSIS — R5383 Other fatigue: Secondary | ICD-10-CM | POA: Diagnosis not present

## 2021-10-01 DIAGNOSIS — Z6841 Body Mass Index (BMI) 40.0 and over, adult: Secondary | ICD-10-CM

## 2021-10-01 DIAGNOSIS — I10 Essential (primary) hypertension: Secondary | ICD-10-CM

## 2021-10-04 LAB — ANEMIA PANEL
Ferritin: 86 ng/mL (ref 15–150)
Folate, Hemolysate: 390 ng/mL
Folate, RBC: 1099 ng/mL (ref >498)
Hematocrit: 35.5 % (ref 34.0–46.6)
Iron Saturation: 26 % (ref 15–55)
Iron: 68 ug/dL (ref 27–159)
Retic Ct Pct: 1.8 % (ref 0.6–2.6)
Total Iron Binding Capacity: 263 ug/dL (ref 250–450)
UIBC: 195 ug/dL (ref 131–425)
Vitamin B-12: 777 pg/mL (ref 232–1245)

## 2021-10-04 LAB — T4, FREE: Free T4: 1.62 ng/dL (ref 0.82–1.77)

## 2021-10-04 LAB — HEMOGLOBIN A1C
Est. average glucose Bld gHb Est-mCnc: 114 mg/dL
Hgb A1c MFr Bld: 5.6 % (ref 4.8–5.6)

## 2021-10-04 LAB — T3: T3, Total: 94 ng/dL (ref 71–180)

## 2021-10-04 LAB — FOLATE: Folate: >20 ng/mL (ref >3.0)

## 2021-10-04 LAB — INSULIN, RANDOM: INSULIN: 8.2 u[IU]/mL (ref 2.6–24.9)

## 2021-10-08 NOTE — Progress Notes (Signed)
Chief Complaint:   OBESITY [name] Hughes (MR# [id number]) is a 56 y.o. female who presents for evaluation and treatment of obesity and related comorbidities. Current BMI is Body mass index is 45.68 kg/m. [name] Hughes has been struggling with her weight for many years and has been unsuccessful in either losing weight, maintaining weight loss, or reaching her healthy weight goal.  [name] Hughes was referred by Dr. Berenice Primas. [facility]. She does not like eggs, skips breakfast. Wake up in the am drink water. 11 am--chips (1 small bag). Lunch 2pm--[place] (3 oz) and veggies or [place], (2 slices) sandwich with cheese and mayo. After dinner--eat same as lunch (satisfied). Oatmeal cake, bag of chips, popcorn. Previously on Phentermine.  [name] Hughes is currently in the action stage of change and ready to dedicate time achieving and maintaining a healthier weight. [name] Hughes is interested in becoming our patient and working on intensive lifestyle modifications including (but not limited to) diet and exercise for weight loss.  [name] Hughes habits were reviewed today and are as follows: Her family eats meals together, she thinks her family will eat healthier with her, her desired weight loss is 86 pounds, she has been heavy most of her life, she started gaining weight after marriage, her heaviest weight ever was 320 pounds, she is a picky eater and doesn't like to eat healthier foods, she snacks frequently in the evenings, she wakes up frequently in the middle of the night to eat, she skips meals frequently, she frequently makes poor food choices, and she struggles with emotional eating.  Depression Screen [name] Hughes Food and Mood (modified PHQ-9) score was 10.     10/01/2021    8:24 AM  Depression screen PHQ 2/9  Decreased Interest 1  Down, Depressed, Hopeless 2  PHQ - 2 Score 3  Altered sleeping 3  Tired, decreased energy 1  Change in appetite 1  Feeling bad or failure about yourself  2  Trouble  concentrating 0  Moving slowly or fidgety/restless 0  Suicidal thoughts 0  PHQ-9 Score 10  Difficult doing work/chores Not difficult at all   Subjective:   1. Other fatigue [name] Hughes admits to daytime somnolence and admits to waking up still tired. Patient has a history of symptoms of daytime fatigue. [name] generally gets 4 or 5 hours of sleep per night, and states that she has difficulty falling asleep. Snoring is not present. Apneic episodes are not present. Epworth Sleepiness Score is 3.    2. SOBOE (shortness of breath on exertion) [name] Hughes notes increasing shortness of breath with exercising and seems to be worsening over time with weight gain. She notes getting out of breath sooner with activity than she used to. This has not gotten worse recently. Revel denies shortness of breath at rest or orthopnea.   3. Osteoarthritis of right knee, unspecified osteoarthritis type Needs Rt knee replacement.  4. Polymyalgia rheumatica (HCC) [name] Hughes is on Methotrexate, tapered of Medrol after 2 years. Inflammatory markers are chronically elevated per Rheum.  5. Vitamin D deficiency [name] Hughes last Vit D level of 35. Noted fatigue.  6. Other hyperlipidemia [name] Hughes last LDL 115, HDL 49, Trigly 59. Not on statin.  7. Essential hypertension [name] Hughes blood pressure within normal limits today. On Valsartan. Denies chest pain, chest pressure and headache.  8. Acquired hypothyroidism [name] Hughes is on Synthroid 112 mcg daily. Denies any chest pain,shortness of breath or palpitations.  9. Anemia, unspecified type [name] Hughes normal MCV but chronically low H/H.  Assessment/Plan:   1. Other  fatigue We will obtain labs today. [name] Hughes does feel that her weight is causing her energy to be lower than it should be. Fatigue may be related to obesity, depression or many other causes. Labs will be ordered, and in the meanwhile, [name] Hughes will focus on self care including making healthy food choices, increasing physical  activity and focusing on stress reduction.  - EKG 12-Lead - Hemoglobin A1c - Insulin, random  2. SOBOE (shortness of breath on exertion) [name] Hughes does feel that she gets out of breath more easily that she used to when she exercises. [name] Hughes shortness of breath appears to be obesity related and exercise induced. She has agreed to work on weight loss and gradually increase exercise to treat her exercise induced shortness of breath. Will continue to monitor closely.   3. Osteoarthritis of right knee, unspecified osteoarthritis type Will follow up with Dr. Luiz Blare previously schedule appointment.  4. Polymyalgia rheumatica (HCC) We will obtain labs today.  - Folate  5. Vitamin D deficiency Has Vit D deficiency.  6. Other hyperlipidemia Will repeat labs in 2-3 months.  7. Essential hypertension We will obtain labs today.  8. Acquired hypothyroidism We will obtain labs today.  - T3 - T4, free  9. Anemia, unspecified type We will obtain labs today.  - Vitamin B12 - Anemia panel  10. Depression screening [name] had a positive depression screening. Depression is commonly associated with obesity and often results in emotional eating behaviors. We will monitor this closely and work on CBT to help improve the non-hunger eating patterns. Referral to Psychology may be required if no improvement is seen as she continues in our clinic.   11. Class 3 severe obesity with serious comorbidity and body mass index (BMI) of 45.0 to 49.9 in adult, unspecified obesity type [name] Hughes) [name] is currently in the action stage of change and her goal is to continue with weight loss efforts. I recommend [name] Hughes begin the structured treatment plan as follows:  She has agreed to the Category 2 Plan.  Exercise goals: All adults should avoid inactivity. Some physical activity is better than none, and adults who participate in any amount of physical activity gain some health benefits.   Behavioral modification  strategies: increasing lean protein intake, decreasing eating out, no skipping meals, and meal planning and cooking strategies.  She was informed of the importance of frequent follow-up visits to maximize her success with intensive lifestyle modifications for her multiple health conditions. She was informed we would discuss her lab results at her next visit unless there is a critical issue that needs to be addressed sooner. Destenie agreed to keep her next visit at the agreed upon time to discuss these results.  Objective:   Blood pressure 109/69, pulse 61, temperature 98.4 F (36.9 C), height 5\' 6"  (1.676 m), weight 283 lb (128.4 kg), SpO2 99 %. Body mass index is 45.68 kg/m.  EKG: Normal sinus rhythm, rate 63 bpm.  Indirect Calorimeter completed today shows a VO2 of 208 ml and a REE of 1440.  Her calculated basal metabolic rate is thus her basal metabolic rate is worse than expected.  General: Cooperative, alert, well developed, in no acute distress. HEENT: Conjunctivae and lids unremarkable. Cardiovascular: Regular rhythm.  Lungs: Normal work of breathing. Neurologic: No focal deficits.   Lab Results  Component Value Date   CREATININE 0.82 07/03/2021   BUN 12 07/03/2021   NA 144 07/03/2021   K 3.6 07/03/2021   CL 107 07/03/2021   CO2  27 07/03/2021   Lab Results  Component Value Date   ALT 13 07/03/2021   AST 15 07/03/2021   ALKPHOS 99 01/22/2021   BILITOT 0.4 07/03/2021   Lab Results  Component Value Date   HGBA1C 5.6 10/01/2021   Lab Results  Component Value Date   INSULIN 8.2 10/01/2021   Lab Results  Component Value Date   TSH 1.24 07/03/2021   Lab Results  Component Value Date   CHOL 177 07/03/2021   HDL 47 (L) 07/03/2021   LDLCALC 115 (H) 07/03/2021   TRIG 59 07/03/2021   CHOLHDL 3.8 07/03/2021   Lab Results  Component Value Date   WBC 9.0 07/03/2021   HGB 11.0 (L) 07/03/2021   HCT 35.5 10/01/2021   MCV 90.1 07/03/2021   PLT 257 07/03/2021    Lab Results  Component Value Date   IRON 68 10/01/2021   TIBC 263 10/01/2021   FERRITIN 86 10/01/2021   Attestation Statements:   Reviewed by clinician on day of visit: allergies, medications, problem list, medical history, surgical history, family history, social history, and previous encounter notes.  Time spent on visit including pre-visit chart review and post-visit charting and care was 40 minutes.   I, Fortino Sic, RMA am acting as transcriptionist for Reuben Likes, MD.  I have reviewed the above documentation for accuracy and completeness, and I agree with the above. - Reuben Likes, MD

## 2021-10-15 ENCOUNTER — Ambulatory Visit (INDEPENDENT_AMBULATORY_CARE_PROVIDER_SITE_OTHER): Payer: 59 | Admitting: Family Medicine

## 2021-10-15 ENCOUNTER — Encounter (INDEPENDENT_AMBULATORY_CARE_PROVIDER_SITE_OTHER): Payer: Self-pay | Admitting: Family Medicine

## 2021-10-15 VITALS — BP 116/62 | HR 70 | Temp 98.6°F | Ht 66.0 in | Wt 279.0 lb

## 2021-10-15 DIAGNOSIS — E7849 Other hyperlipidemia: Secondary | ICD-10-CM

## 2021-10-15 DIAGNOSIS — E669 Obesity, unspecified: Secondary | ICD-10-CM

## 2021-10-15 DIAGNOSIS — E559 Vitamin D deficiency, unspecified: Secondary | ICD-10-CM | POA: Diagnosis not present

## 2021-10-15 DIAGNOSIS — E8881 Metabolic syndrome: Secondary | ICD-10-CM | POA: Diagnosis not present

## 2021-10-15 DIAGNOSIS — Z6841 Body Mass Index (BMI) 40.0 and over, adult: Secondary | ICD-10-CM

## 2021-10-15 DIAGNOSIS — I10 Essential (primary) hypertension: Secondary | ICD-10-CM

## 2021-10-15 MED ORDER — VITAMIN D (ERGOCALCIFEROL) 1.25 MG (50000 UNIT) PO CAPS: 50000.0000 [IU] | ORAL_CAPSULE | ORAL | 0 refills | Status: DC

## 2021-10-18 NOTE — Progress Notes (Unsigned)
Chief Complaint:   OBESITY [name] Hughes is here to discuss her progress with her obesity treatment plan along with follow-up of her obesity related diagnoses. [name] Hughes is on the Category 2 Plan and states she is following her eating plan approximately 100% of the time. [name] Hughes states she is walking 3 to 5,000 steps 3 times per week.  Today's visit was #: 2 Starting weight: 283 lbs Starting date: 10/01/21 Today's weight: 279 lbs Today's date: 10/15/2021 Total lbs lost to date: 4 Total lbs lost since last in-office visit: 4  Interim History: [name] Hughes felt the first few weeks were an adjustment. Her weekends were particularly challenging. [name] Hughes needs to remind herself to eat during the weekend days. She is often skipping breakfast on both weekend days and then occasionally supper. [name] Hughes enjoyed the quantity of the sandwich. She is going to [facility] tomorrow for 4 days and she does anticipate eating barbeque.  Subjective:   1. Essential hypertension [name]'s blood pressure is well controlled today. She denies chest pain, chest pressure, or headache.  2. Other hyperlipidemia [name] Hughes's LDL was 115, HDL was 47, and triglycerides were 130. She is not on medication.   3. Vitamin D deficiency [name]'s vitamin D level was 35 and she is not on a vitamin D supplement. She notes fatigue.  4. Insulin resistance [name] Hughes's A1c was 5.6 and her Insulin was 8.2. She is not on medication.  Assessment/Plan:   1. Essential hypertension We will follow up Kinslie's blood pressure at the next appointment.  2. Other hyperlipidemia [name] Hughes agrees to continue lifestyle changes. We will repeat labs in 3 months.  3. Vitamin D deficiency [name] Hughes agrees to start taking vitamin D 50,000IU weekly and will follow up as agreed.  - Vitamin D, Ergocalciferol, (DRISDOL) 1.25 MG (50000 UNIT) CAPS capsule; Take 1 capsule (50,000 Units total) by mouth every 7 (seven) days.  Dispense: 4 capsule; Refill: 0  4. Insulin  resistance Pathophysiology of insulin resistant, prediabetes, and diabetes were discussed. No medications were prescribed at this time.  5. Obesity with current BMI of 45.1 [name] Hughes is currently in the action stage of change. As such, her goal is to continue with weight loss efforts. She has agreed to the Category 2 Plan.   Exercise goals: No exercise has been prescribed at this time.  Behavioral modification strategies: increasing lean protein intake, meal planning and cooking strategies, keeping healthy foods in the home, travel eating strategies, and planning for success.  [name] Hughes has agreed to follow-up with our clinic in 3 weeks. She was informed of the importance of frequent follow-up visits to maximize her success with intensive lifestyle modifications for her multiple health conditions.   Objective:   Blood pressure 116/62, pulse 70, temperature 98.6 F (37 C), height 5\' 6"  (1.676 m), weight 279 lb (126.6 kg), SpO2 98 %. Body mass index is 45.03 kg/m.  General: Cooperative, alert, well developed, in no acute distress. HEENT: Conjunctivae and lids unremarkable. Cardiovascular: Regular rhythm.  Lungs: Normal work of breathing. Neurologic: No focal deficits.   Lab Results  Component Value Date   CREATININE 0.82 07/03/2021   BUN 12 07/03/2021   NA 144 07/03/2021   K 3.6 07/03/2021   CL 107 07/03/2021   CO2 27 07/03/2021   Lab Results  Component Value Date   ALT 13 07/03/2021   AST 15 07/03/2021   ALKPHOS 99 01/22/2021   BILITOT 0.4 07/03/2021   Lab Results  Component Value Date   HGBA1C 5.6 10/01/2021  Lab Results  Component Value Date   INSULIN 8.2 10/01/2021   Lab Results  Component Value Date   TSH 1.24 07/03/2021   Lab Results  Component Value Date   CHOL 177 07/03/2021   HDL 47 (L) 07/03/2021   LDLCALC 115 (H) 07/03/2021   TRIG 59 07/03/2021   CHOLHDL 3.8 07/03/2021   Lab Results  Component Value Date   VD25OH 35 07/03/2021   Lab Results   Component Value Date   WBC 9.0 07/03/2021   HGB 11.0 (L) 07/03/2021   HCT 35.5 10/01/2021   MCV 90.1 07/03/2021   PLT 257 07/03/2021   Lab Results  Component Value Date   IRON 68 10/01/2021   TIBC 263 10/01/2021   FERRITIN 86 10/01/2021   Attestation Statements:   Reviewed by clinician on day of visit: allergies, medications, problem list, medical history, surgical history, family history, social history, and previous encounter notes.  Time spent on visit including pre-visit chart review and post-visit care and charting was 42 minutes.   IKirke Corin, CMA, am acting as transcriptionist for Reuben Likes, MD  I have reviewed the above documentation for accuracy and completeness, and I agree with the above. - Reuben Likes, MD

## 2021-10-29 ENCOUNTER — Ambulatory Visit (INDEPENDENT_AMBULATORY_CARE_PROVIDER_SITE_OTHER): Payer: 59 | Admitting: Internal Medicine

## 2021-10-29 ENCOUNTER — Encounter (INDEPENDENT_AMBULATORY_CARE_PROVIDER_SITE_OTHER): Payer: Self-pay | Admitting: Internal Medicine

## 2021-10-29 VITALS — BP 106/56 | HR 69 | Temp 98.3°F | Ht 66.0 in | Wt 280.0 lb

## 2021-10-29 DIAGNOSIS — Z6841 Body Mass Index (BMI) 40.0 and over, adult: Secondary | ICD-10-CM

## 2021-10-29 DIAGNOSIS — I10 Essential (primary) hypertension: Secondary | ICD-10-CM

## 2021-10-29 DIAGNOSIS — E669 Obesity, unspecified: Secondary | ICD-10-CM

## 2021-10-29 DIAGNOSIS — E559 Vitamin D deficiency, unspecified: Secondary | ICD-10-CM | POA: Diagnosis not present

## 2021-10-30 MED ORDER — VITAMIN D (ERGOCALCIFEROL) 1.25 MG (50000 UNIT) PO CAPS: 50000.0000 [IU] | ORAL_CAPSULE | ORAL | 0 refills | Status: DC

## 2021-11-05 NOTE — Progress Notes (Unsigned)
Chief Complaint:   OBESITY [name] Hughes is here to discuss her progress with her obesity treatment plan along with follow-up of her obesity related diagnoses. [name] Hughes is on the Category 2 Plan and states she is following her eating plan approximately 85% of the time. [name] Hughes states she is walking 35 minutes 4 times per week.  Today's visit was #: 3 Starting weight: 283 lbs Starting date: 10/01/2021 Today's weight: 280 lbs Today's date: 10/29/2021 Total lbs lost to date: 3 lbs Total lbs lost since last in-office visit: +1 lb  Interim History: [name] Hughes reports adherence to meal plan around 85% of the time. She denies increased hunger or cravings signals.  She has begun to to walk several times per week.  She has been considering a Photographer. She is not following meal plan on weekends.  She habitually does not eat, but in food recall she eats out about one meal.  IC <calculated.    Subjective:   1. Vitamin D deficiency Discussed labs with patient today. 07/25/21, Vitamin D level was 35.  Goal level is 50-60, associated with adiposity.   2. Essential hypertension Blood pressure is at goal as orthostatic.   Assessment/Plan:   1. Vitamin D deficiency Continue supplementation. Recheck labs in December 2023.  Refill - Vitamin D, Ergocalciferol, (DRISDOL) 1.25 MG (50000 UNIT) CAPS capsule; Take 1 capsule (50,000 Units total) by mouth every 7 (seven) days.  Dispense: 4 capsule; Refill: 0  2. Essential hypertension Continue blood pressure medications.  Counseled on increased fluid intake.   3. Obesity with current BMI of 45.1 Continue reduced calorie meal plan.  Increase protein and adherence over the weekends.  She may use low carb wrap instead of bread with lunch.   [name] Hughes is currently in the action stage of change. As such, her goal is to continue with weight loss efforts. She has agreed to the Category 2 Plan.   Exercise goals:  Walk 10-15 minutes at least 3-5 times per week.    Behavioral modification strategies: increasing lean protein intake and no skipping meals.  [name] Hughes has agreed to follow-up with our clinic in 2 weeks. She was informed of the importance of frequent follow-up visits to maximize her success with intensive lifestyle modifications for her multiple health conditions.   Objective:   Blood pressure (!) 106/56, pulse 69, temperature 98.3 F (36.8 C), height 5\' 6"  (1.676 m), weight 280 lb (127 kg), SpO2 99 %. Body mass index is 45.19 kg/m.  General: Cooperative, alert, well developed, in no acute distress. HEENT: Conjunctivae and lids unremarkable. Cardiovascular: Regular rhythm.  Lungs: Normal work of breathing. Neurologic: No focal deficits.   Lab Results  Component Value Date   CREATININE 0.82 07/03/2021   BUN 12 07/03/2021   NA 144 07/03/2021   K 3.6 07/03/2021   CL 107 07/03/2021   CO2 27 07/03/2021   Lab Results  Component Value Date   ALT 13 07/03/2021   AST 15 07/03/2021   ALKPHOS 99 01/22/2021   BILITOT 0.4 07/03/2021   Lab Results  Component Value Date   HGBA1C 5.6 10/01/2021   Lab Results  Component Value Date   INSULIN 8.2 10/01/2021   Lab Results  Component Value Date   TSH 1.24 07/03/2021   Lab Results  Component Value Date   CHOL 177 07/03/2021   HDL 47 (L) 07/03/2021   LDLCALC 115 (H) 07/03/2021   TRIG 59 07/03/2021   CHOLHDL 3.8 07/03/2021   Lab Results  Component  Value Date   VD25OH 35 07/03/2021   Lab Results  Component Value Date   WBC 9.0 07/03/2021   HGB 11.0 (L) 07/03/2021   HCT 35.5 10/01/2021   MCV 90.1 07/03/2021   PLT 257 07/03/2021   Lab Results  Component Value Date   IRON 68 10/01/2021   TIBC 263 10/01/2021   FERRITIN 86 10/01/2021    Attestation Statements:   Reviewed by clinician on day of visit: allergies, medications, problem list, medical history, surgical history, family history, social history, and previous encounter notes.  Time spent on visit including  pre-visit chart review and post-visit care and charting was 25 minutes.   I, Davy Pique, RMA, am acting as transcriptionist for Maudry Mayhew, MD.  I have reviewed the above documentation for accuracy and completeness, and I agree with the above. Thomes Dinning, MD

## 2021-11-07 ENCOUNTER — Other Ambulatory Visit (INDEPENDENT_AMBULATORY_CARE_PROVIDER_SITE_OTHER): Payer: Self-pay | Admitting: Family Medicine

## 2021-11-07 DIAGNOSIS — E559 Vitamin D deficiency, unspecified: Secondary | ICD-10-CM

## 2021-11-19 ENCOUNTER — Ambulatory Visit (INDEPENDENT_AMBULATORY_CARE_PROVIDER_SITE_OTHER): Payer: 59 | Admitting: Family Medicine

## 2021-12-08 ENCOUNTER — Ambulatory Visit (INDEPENDENT_AMBULATORY_CARE_PROVIDER_SITE_OTHER): Payer: 59 | Admitting: Internal Medicine

## 2021-12-08 ENCOUNTER — Encounter (INDEPENDENT_AMBULATORY_CARE_PROVIDER_SITE_OTHER): Payer: Self-pay | Admitting: Internal Medicine

## 2021-12-08 VITALS — BP 120/78 | HR 69 | Temp 98.0°F | Ht 66.0 in | Wt 277.0 lb

## 2021-12-08 DIAGNOSIS — E669 Obesity, unspecified: Secondary | ICD-10-CM

## 2021-12-08 DIAGNOSIS — I10 Essential (primary) hypertension: Secondary | ICD-10-CM

## 2021-12-08 DIAGNOSIS — E559 Vitamin D deficiency, unspecified: Secondary | ICD-10-CM | POA: Diagnosis not present

## 2021-12-08 DIAGNOSIS — Z6841 Body Mass Index (BMI) 40.0 and over, adult: Secondary | ICD-10-CM

## 2021-12-08 MED ORDER — VITAMIN D (ERGOCALCIFEROL) 1.25 MG (50000 UNIT) PO CAPS: 50000.0000 [IU] | ORAL_CAPSULE | ORAL | 0 refills | Status: DC

## 2021-12-13 ENCOUNTER — Other Ambulatory Visit (INDEPENDENT_AMBULATORY_CARE_PROVIDER_SITE_OTHER): Payer: Self-pay | Admitting: Internal Medicine

## 2021-12-13 DIAGNOSIS — E559 Vitamin D deficiency, unspecified: Secondary | ICD-10-CM

## 2021-12-22 NOTE — Progress Notes (Signed)
Chief Complaint:   OBESITY [name] Hughes is here to discuss her progress with her obesity treatment plan along with follow-up of her obesity related diagnoses. [name] Hughes is on the Category 2 Plan and states she is following her eating plan approximately 60% of the time. [name] Hughes states she is not currently exercising.  Today's visit was #: 4 Starting weight: 283 lbs Starting date: 10/01/2021 Today's weight: 277 lbs Today's date: 12/08/2021 Total lbs lost to date: 6 Total lbs lost since last in-office visit: 3  Interim History: [name] Hughes is no longer skipping meals on the weekends and has increased protein intake. Pt reports adequate satiety and satiation. She inquires about physical activity, considering her arthritis. Pt has a membership at [employer] but is not using it. She also has fitness equipment at home.  Subjective:   1. Essential hypertension BP well controlled. Pt denies side effects or orthostasis.  2. Vitamin D deficiency She is currently taking prescription vitamin D 50,000 IU each week. She denies nausea, vomiting or muscle weakness.  Assessment/Plan:   1. Essential hypertension Weight loss therapy. Continue Diovan HCT.  2. Vitamin D deficiency Low Vitamin D level contributes to fatigue and are associated with obesity, breast, and colon cancer. She agrees to continue to take prescription Vitamin D 50,000 IU every week and will follow-up for routine testing of Vitamin D, at least 2-3 times per year to avoid over-replacement.  Refill x 3 months- Vitamin D, Ergocalciferol, (DRISDOL) 1.25 MG (50000 UNIT) CAPS capsule; Take 1 capsule (50,000 Units total) by mouth every 7 (seven) days.  Dispense: 13 capsule; Refill: 0  3. Obesity with current BMI of 44.8 [name] Hughes is currently in the action stage of change. As such, her goal is to continue with weight loss efforts. She has agreed to the Category 2 Plan.   Handout: Lunch Substitutions  Exercise goals:  Light weights and YouTube videos  2-3 days a week.  Behavioral modification strategies: increasing lean protein intake, increasing water intake, meal planning and cooking strategies, and planning for success.  [name] Hughes has agreed to follow-up with our clinic in 2 weeks. She was informed of the importance of frequent follow-up visits to maximize her success with intensive lifestyle modifications for her multiple health conditions.   Objective:   Blood pressure 120/78, pulse 69, temperature 98 F (36.7 C), height 5\' 6"  (1.676 m), weight 277 lb (125.6 kg), SpO2 99 %. Body mass index is 44.71 kg/m.  General: Cooperative, alert, well developed, in no acute distress. HEENT: Conjunctivae and lids unremarkable. Cardiovascular: Regular rhythm.  Lungs: Normal work of breathing. Neurologic: No focal deficits.   Lab Results  Component Value Date   CREATININE 0.82 07/03/2021   BUN 12 07/03/2021   NA 144 07/03/2021   K 3.6 07/03/2021   CL 107 07/03/2021   CO2 27 07/03/2021   Lab Results  Component Value Date   ALT 13 07/03/2021   AST 15 07/03/2021   ALKPHOS 99 01/22/2021   BILITOT 0.4 07/03/2021   Lab Results  Component Value Date   HGBA1C 5.6 10/01/2021   Lab Results  Component Value Date   INSULIN 8.2 10/01/2021   Lab Results  Component Value Date   TSH 1.24 07/03/2021   Lab Results  Component Value Date   CHOL 177 07/03/2021   HDL 47 (L) 07/03/2021   LDLCALC 115 (H) 07/03/2021   TRIG 59 07/03/2021   CHOLHDL 3.8 07/03/2021   Lab Results  Component Value Date   VD25OH 35 07/03/2021  Lab Results  Component Value Date   WBC 9.0 07/03/2021   HGB 11.0 (L) 07/03/2021   HCT 35.5 10/01/2021   MCV 90.1 07/03/2021   PLT 257 07/03/2021   Lab Results  Component Value Date   IRON 68 10/01/2021   TIBC 263 10/01/2021   FERRITIN 86 10/01/2021   Attestation Statements:   Reviewed by clinician on day of visit: allergies, medications, problem list, medical history, surgical history, family history, social  history, and previous encounter notes.  Time spent on visit including pre-visit chart review and post-visit care and charting was 20 minutes.   I, Kyung Rudd, BS, CMA, am acting as transcriptionist for Worthy Rancher, MD.  I have reviewed the above documentation for accuracy and completeness, and I agree with the above. -Worthy Rancher, MD

## 2022-01-05 ENCOUNTER — Encounter (INDEPENDENT_AMBULATORY_CARE_PROVIDER_SITE_OTHER): Payer: Self-pay | Admitting: Internal Medicine

## 2022-01-05 ENCOUNTER — Ambulatory Visit (INDEPENDENT_AMBULATORY_CARE_PROVIDER_SITE_OTHER): Payer: 59 | Admitting: Internal Medicine

## 2022-01-05 ENCOUNTER — Other Ambulatory Visit (INDEPENDENT_AMBULATORY_CARE_PROVIDER_SITE_OTHER): Payer: Self-pay | Admitting: Internal Medicine

## 2022-01-05 VITALS — BP 132/78 | HR 81 | Temp 98.1°F | Ht 66.0 in | Wt 276.0 lb

## 2022-01-05 DIAGNOSIS — E669 Obesity, unspecified: Secondary | ICD-10-CM | POA: Diagnosis not present

## 2022-01-05 DIAGNOSIS — Z6841 Body Mass Index (BMI) 40.0 and over, adult: Secondary | ICD-10-CM | POA: Diagnosis not present

## 2022-01-05 DIAGNOSIS — I10 Essential (primary) hypertension: Secondary | ICD-10-CM | POA: Diagnosis not present

## 2022-01-05 MED ORDER — TOPIRAMATE 25 MG PO TABS: 25.0000 mg | ORAL_TABLET | Freq: Every evening | ORAL | 0 refills | Status: DC

## 2022-01-17 NOTE — Progress Notes (Signed)
Chief Complaint:   OBESITY [name] Hughes is here to discuss her progress with her obesity treatment plan along with follow-up of her obesity related diagnoses. [name] Hughes is on the Category 2 Plan and states she is following her eating plan approximately 80% of the time. [name] states she is +not currently exercising.  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 She managed to lose 1 lb. She had been seen in the past at a weight management center and tried phentermine topiramate combination. She stopped medication due to jitteriness. Pt notes nighttime cravings for sweets. She also has problems staying asleep. Adequate satiety with meal plan when adherence is high.  Subjective:   1. Essential hypertension BP well controlled on valsartan-HCTZ.  Assessment/Plan:   1. Essential hypertension Check CMP and magnesium at next visit due to topiramate. Continue current regimen.  2. Obesity with current BMI of 44.6 [name] Hughes is currently in the action stage of change. As such, her goal is to continue with weight loss efforts. She has agreed to the Category 2 Plan.   After discussion of benefits, common side effects, off label use and shared decision making we will start topiramate 25 mg in evening. Med will help with nighttime cravings and sleep. Check BMP, magnesium at next OV.  Suspected jitteriness was due to phentermine and not topiramate.  Start- topiramate (TOPAMAX) 25 MG tablet; Take 1 tablet (25 mg total) by mouth every evening.  Dispense: 30 tablet; Refill: 0  Exercise goals:  As is  Behavioral modification strategies: increasing lean protein intake, decreasing simple carbohydrates, increasing water intake, ways to avoid night time snacking, emotional eating  strategies, and avoiding temptations.  [name] has agreed to follow-up with our clinic in 2-3 weeks. She was informed of the importance of frequent follow-up visits to maximize her success with intensive lifestyle modifications for her multiple health conditions.   Objective:   Blood pressure 132/78, pulse 81, temperature 98.1 F (36.7 C), height 5\' 6"  (1.676 m), weight 276 lb (125.2 kg), SpO2 99 %. Body mass index is 44.55 kg/m.  General: Cooperative, alert, well developed, in no acute distress. HEENT: Conjunctivae and lids unremarkable. Cardiovascular: Regular rhythm.  Lungs: Normal work of breathing. Neurologic: No focal deficits.   Lab Results  Component Value Date   CREATININE 0.82 07/03/2021   BUN 12 07/03/2021   NA 144 07/03/2021   K 3.6 07/03/2021   CL 107 07/03/2021   CO2 27 07/03/2021   Lab Results  Component Value Date   ALT 13 07/03/2021   AST 15 07/03/2021   ALKPHOS 99 01/22/2021   BILITOT 0.4 07/03/2021   Lab Results  Component Value Date   HGBA1C 5.6 10/01/2021   Lab Results  Component Value Date   INSULIN 8.2 10/01/2021   Lab Results  Component Value Date   TSH 1.24 07/03/2021   Lab Results  Component Value Date   CHOL 177 07/03/2021   HDL 47 (L) 07/03/2021   LDLCALC 115 (H) 07/03/2021   TRIG 59 07/03/2021   CHOLHDL 3.8 07/03/2021   Lab Results  Component Value Date   VD25OH 35 07/03/2021   Lab Results  Component Value Date   WBC 9.0 07/03/2021   HGB 11.0 (L) 07/03/2021   HCT 35.5 10/01/2021   MCV  90.1 07/03/2021   PLT 257 07/03/2021   Lab Results  Component Value Date   IRON 68 10/01/2021   TIBC 263 10/01/2021   FERRITIN 86 10/01/2021    Attestation Statements:   Reviewed by clinician on day of visit: allergies, medications, problem list, medical history, surgical history, family history, social history, and previous encounter notes.  Time spent on visit including pre-visit chart review and post-visit care and charting was 20  minutes.   I, Kyung Rudd, BS, CMA, am acting as transcriptionist for Worthy Rancher, MD.  I have reviewed the above documentation for accuracy and completeness, and I agree with the above. -Worthy Rancher, MD

## 2022-01-21 ENCOUNTER — Ambulatory Visit (INDEPENDENT_AMBULATORY_CARE_PROVIDER_SITE_OTHER): Payer: 59 | Admitting: Physician Assistant

## 2022-02-09 ENCOUNTER — Other Ambulatory Visit (INDEPENDENT_AMBULATORY_CARE_PROVIDER_SITE_OTHER): Payer: Self-pay | Admitting: Internal Medicine

## 2022-02-09 DIAGNOSIS — E559 Vitamin D deficiency, unspecified: Secondary | ICD-10-CM

## 2022-03-05 ENCOUNTER — Ambulatory Visit (INDEPENDENT_AMBULATORY_CARE_PROVIDER_SITE_OTHER): Payer: 59 | Admitting: Physician Assistant

## 2022-04-10 ENCOUNTER — Encounter: Payer: Self-pay | Admitting: Family

## 2022-04-10 ENCOUNTER — Ambulatory Visit (INDEPENDENT_AMBULATORY_CARE_PROVIDER_SITE_OTHER): Payer: 59 | Admitting: Family

## 2022-04-10 ENCOUNTER — Other Ambulatory Visit: Payer: Self-pay | Admitting: Family

## 2022-04-10 VITALS — BP 124/76 | HR 56 | Temp 97.8°F | Ht 66.0 in | Wt 289.2 lb

## 2022-04-10 DIAGNOSIS — I10 Essential (primary) hypertension: Secondary | ICD-10-CM | POA: Diagnosis not present

## 2022-04-10 DIAGNOSIS — E039 Hypothyroidism, unspecified: Secondary | ICD-10-CM

## 2022-04-10 DIAGNOSIS — R1084 Generalized abdominal pain: Secondary | ICD-10-CM

## 2022-04-10 DIAGNOSIS — F5081 Binge eating disorder: Secondary | ICD-10-CM

## 2022-04-10 DIAGNOSIS — M0579 Rheumatoid arthritis with rheumatoid factor of multiple sites without organ or systems involvement: Secondary | ICD-10-CM

## 2022-04-10 DIAGNOSIS — F339 Major depressive disorder, recurrent, unspecified: Secondary | ICD-10-CM

## 2022-04-10 DIAGNOSIS — K5909 Other constipation: Secondary | ICD-10-CM

## 2022-04-10 LAB — CBC WITH DIFFERENTIAL/PLATELET
Basophils Absolute: 0.1 10*3/uL (ref 0.0–0.1)
Basophils Relative: 0.8 % (ref 0.0–3.0)
Eosinophils Absolute: 0.3 10*3/uL (ref 0.0–0.7)
Eosinophils Relative: 3.8 % (ref 0.0–5.0)
HCT: 33.9 % — ABNORMAL LOW (ref 36.0–46.0)
Hemoglobin: 10.8 g/dL — ABNORMAL LOW (ref 12.0–15.0)
Lymphocytes Relative: 25.5 % (ref 12.0–46.0)
Lymphs Abs: 1.8 10*3/uL (ref 0.7–4.0)
MCHC: 31.9 g/dL (ref 30.0–36.0)
MCV: 90.6 fl (ref 78.0–100.0)
Monocytes Absolute: 0.5 10*3/uL (ref 0.1–1.0)
Monocytes Relative: 7.5 % (ref 3.0–12.0)
Neutro Abs: 4.5 10*3/uL (ref 1.4–7.7)
Neutrophils Relative %: 62.4 % (ref 43.0–77.0)
Platelets: 220 10*3/uL (ref 150.0–400.0)
RBC: 3.74 Mil/uL — ABNORMAL LOW (ref 3.87–5.11)
RDW: 15.4 % (ref 11.5–15.5)
WBC: 7.2 10*3/uL (ref 4.0–10.5)

## 2022-04-10 LAB — TSH: TSH: 0.06 u[IU]/mL — ABNORMAL LOW (ref 0.35–5.50)

## 2022-04-10 LAB — COMPREHENSIVE METABOLIC PANEL
ALT: 14 U/L (ref 0–35)
AST: 17 U/L (ref 0–37)
Albumin: 3.2 g/dL — ABNORMAL LOW (ref 3.5–5.2)
Alkaline Phosphatase: 92 U/L (ref 39–117)
BUN: 12 mg/dL (ref 6–23)
CO2: 31 mEq/L (ref 19–32)
Calcium: 8.8 mg/dL (ref 8.4–10.5)
Chloride: 106 mEq/L (ref 96–112)
Creatinine, Ser: 0.77 mg/dL (ref 0.40–1.20)
GFR: 85.99 mL/min (ref >60.00)
Glucose, Bld: 83 mg/dL (ref 70–99)
Potassium: 3.4 mEq/L — ABNORMAL LOW (ref 3.5–5.1)
Sodium: 143 mEq/L (ref 135–145)
Total Bilirubin: 0.5 mg/dL (ref 0.2–1.2)
Total Protein: 5.8 g/dL — ABNORMAL LOW (ref 6.0–8.3)

## 2022-04-10 MED ORDER — VALSARTAN-HYDROCHLOROTHIAZIDE 320-25 MG PO TABS: 1.0000 | ORAL_TABLET | Freq: Every day | ORAL | 3 refills | Status: DC

## 2022-04-10 MED ORDER — LINACLOTIDE 290 MCG PO CAPS: 290.0000 ug | ORAL_CAPSULE | Freq: Every day | ORAL | 1 refills | Status: DC

## 2022-04-10 MED ORDER — LEVOTHYROXINE SODIUM 112 MCG PO TABS: 112.0000 ug | ORAL_TABLET | Freq: Every day | ORAL | 0 refills | Status: DC

## 2022-04-10 MED ORDER — LEVOTHYROXINE SODIUM 100 MCG PO TABS: 100.0000 ug | ORAL_TABLET | Freq: Every day | ORAL | 3 refills | Status: DC

## 2022-04-10 NOTE — Telephone Encounter (Signed)
Can we see if the reason linzess is expensive is because it might need a prior auth?

## 2022-04-10 NOTE — Patient Instructions (Signed)
  A referral was placed today for therapy.  Please let us know if you have not heard back within 2 weeks about the referral.  Stop by the lab prior to leaving today. I will notify you of your results once received.    Regards,   Elysse Polidore FNP-C    

## 2022-04-10 NOTE — Progress Notes (Unsigned)
   Established Patient Office Visit  Subjective:   Patient ID: [name] Hughes, female    DOB: May 19, 1965  Age: 57 y.o. MRN: [id number]  CC:  Chief Complaint  Patient presents with   Abdominal Pain    With constipation.    HPI: [name] Hughes is a 57 y.o. female presenting on 04/10/2022 for Abdominal Pain (With constipation.)   HPI  C/o abdominal pain with constipation. She notices it hurts generalized upper abdominal area in both sides intermittently. Went to GI doctor in the past for this but it had resolved for some time but then reoccured.  She does typically have constipation since adulthood or teenage years. She has a bowel movement about every three or four days.   Denies nausea and or vomiting.  Does not have a gallbladder.  Not often with heartburn.   Concerned with weight hard for her to lose weight.  She might lose about five pounds and then gains it back.  Does not exercise.  Has a terrible diet.      ROS: Negative unless specifically indicated above in HPI.   Relevant past medical history reviewed and updated as indicated.   Allergies and medications reviewed and updated.   Current Outpatient Medications:    acetaminophen (TYLENOL) 500 MG tablet, Take 500 mg by mouth every 6 (six) hours as needed., Disp: , Rfl:    folic acid (FOLVITE) 0.5 MG tablet, Take 0.5 mg by mouth daily., Disp: , Rfl:    gabapentin (NEURONTIN) 300 MG capsule, Take 300 mg by mouth at bedtime., Disp: , Rfl:    methotrexate (RHEUMATREX) 2.5 MG tablet, TAKE 8 TABLETS BY MOUTH ONCE WEEKLY( TAKE ALL AT ONCE), Disp: , Rfl:    Multiple Vitamin (MULTIVITAMIN) tablet, Take 1 tablet by mouth daily., Disp: , Rfl:    SYNTHROID 112 MCG tablet, Take 112 mcg by mouth daily before breakfast. , Disp: , Rfl:    valsartan-hydrochlorothiazide (DIOVAN-HCT) 320-25 MG per tablet, Take 1 tablet by mouth daily., Disp: , Rfl:   No Known Allergies  Objective:   BP 124/76   Pulse (!) 56   Temp 97.8 F (36.6 C)  (Temporal)   Ht 5\' 6"  (1.676 m)   Wt 289 lb 3.2 oz (131.2 kg)   SpO2 98%   BMI 46.68 kg/m    Physical Exam  Assessment & Plan:  There are no diagnoses linked to this encounter.   Follow up plan: No follow-ups on file.  Eugenia Pancoast, FNP

## 2022-04-13 ENCOUNTER — Other Ambulatory Visit (HOSPITAL_COMMUNITY): Payer: Self-pay

## 2022-04-13 DIAGNOSIS — F5081 Binge eating disorder: Secondary | ICD-10-CM | POA: Insufficient documentation

## 2022-04-13 DIAGNOSIS — F339 Major depressive disorder, recurrent, unspecified: Secondary | ICD-10-CM | POA: Insufficient documentation

## 2022-04-13 DIAGNOSIS — K5909 Other constipation: Secondary | ICD-10-CM | POA: Insufficient documentation

## 2022-04-13 DIAGNOSIS — R1084 Generalized abdominal pain: Secondary | ICD-10-CM | POA: Insufficient documentation

## 2022-04-13 DIAGNOSIS — F50819 Binge eating disorder, unspecified: Secondary | ICD-10-CM | POA: Insufficient documentation

## 2022-04-13 NOTE — Assessment & Plan Note (Signed)
Did encourage regular exercise as tolerated as this could improve the pain.

## 2022-04-13 NOTE — Assessment & Plan Note (Signed)
Ordering tsh pending results 

## 2022-04-13 NOTE — Assessment & Plan Note (Addendum)
Admits to stress eating Recommend therapy, pt ok with this, sending referral.  Advised pt to work on diet.  Recommended nutritionist pt declines.

## 2022-04-13 NOTE — Assessment & Plan Note (Signed)
Trial start linzess 290 mcg once daily sent to pharmacy  Pt advised to:   Add fiber supplement once daily.  Add a probiotic (such as Florastor) daily. Drink 64 oz of water a day. Eat lots of fresh fruit and veggies. Ensure regular exercise.    If you are not able to have regular BM's with the above regimen, you may add miralax 1 tablespoon daily.  Increase or decrease amount/frequency as needed to ensure 1 soft BM/day.

## 2022-04-16 ENCOUNTER — Other Ambulatory Visit (HOSPITAL_COMMUNITY): Payer: Self-pay

## 2022-04-16 NOTE — Telephone Encounter (Signed)
I believe [name] does offer manufacturer coupons, but I looks like the patient has a deductible that hasn't been met yet.

## 2022-05-12 ENCOUNTER — Other Ambulatory Visit (INDEPENDENT_AMBULATORY_CARE_PROVIDER_SITE_OTHER): Payer: 59

## 2022-05-12 DIAGNOSIS — E039 Hypothyroidism, unspecified: Secondary | ICD-10-CM

## 2022-05-12 LAB — TSH: TSH: 0.95 u[IU]/mL (ref 0.35–5.50)

## 2022-05-13 ENCOUNTER — Encounter: Payer: Self-pay | Admitting: Family

## 2022-05-13 NOTE — Telephone Encounter (Signed)
Thyroid level within normal limits, per results today. Pt takes Take 1 tablet (100 mcg total) by mouth daily.

## 2022-05-29 ENCOUNTER — Ambulatory Visit (INDEPENDENT_AMBULATORY_CARE_PROVIDER_SITE_OTHER): Payer: 59 | Admitting: Clinical

## 2022-05-29 ENCOUNTER — Telehealth: Payer: Self-pay

## 2022-05-29 DIAGNOSIS — F32A Depression, unspecified: Secondary | ICD-10-CM | POA: Diagnosis not present

## 2022-05-29 NOTE — Telephone Encounter (Signed)
PA initiated via Covermymeds; KEY: [id number].   This medication or product is on your plan's list of covered drugs. Prior authorization is not required at this time. If your pharmacy has questions regarding the processing of your prescription, please have them call the [facility] pharmacy help desk at [phone]. **Please note: This request was submitted electronically. Formulary lowering, tiering exception, cost reduction and/or pre-benefit determination review (including prospective Medicare hospice reviews) requests cannot be requested using this method of submission. Providers contact us at [phone] for further assistance.

## 2022-05-29 NOTE — Progress Notes (Signed)
                Osualdo Hansell, LCSW 

## 2022-05-29 NOTE — Progress Notes (Signed)
[facility] Counselor Initial Adult Exam  Name: [name] Hughes Date: 05/29/2022 MRN: [id number] DOB: 1966-01-21 PCP: Mort Sawyers, FNP  Time spent: 12:30pm - 1:01pm   Guardian/Payee:  NA    Paperwork requested: No   Reason for Visit /Presenting Problem: Patient stated, "when I was taking to my doctor, we were discussing getting my mental state back together and trying to get on track to lose weight". Patient reported getting frustrated and angry in regards to weight loss.   Mental Status Exam: Appearance:   Well Groomed     Behavior:  Appropriate  Motor:  Normal  Speech/Language:   Clear and Coherent  Affect:  Appropriate  Mood:  normal patient stated, "its ok" in response to current mood  Thought process:  normal  Thought content:    WNL  Sensory/Perceptual disturbances:    WNL  Orientation:  oriented to person, place, situation, and day of week  Attention:  Good  Concentration:  Good  Memory:  WNL  Fund of knowledge:   Good  Insight:    Good  Judgment:   Good  Impulse Control:  Good   Reported Symptoms:  Patient reported lack of motivation, fatigue, difficulty staying asleep, increased appetite, weight gain, "blah" mood, frustration and anger regarding weight loss, decreased self esteem. Patient reported symptoms started approximately 2 years ago when patient started taking steroids.   Risk Assessment: Danger to Self:  No patient denied current and past suicidal ideation and symptoms of psychosis Self-injurious Behavior: No Danger to Others: No Patient denied current and past homicidal ideation  Duty to Warn:no Physical Aggression / Violence:No  Access to Firearms a concern: No  Gang Involvement:No  Patient / guardian was educated about steps to take if suicide or homicide risk level increases between visits: yes While future psychiatric events cannot be accurately predicted, the patient does not currently require acute inpatient psychiatric care and does  not currently meet [facility] involuntary commitment criteria.  Substance Abuse History: Current substance abuse: Yes  Patient reported she currently smokes tobacco, 6-7 cigarettes, daily, with last use today. Patient reported she currently drinks alcohol, 1-2 mixed drinks on Saturdays, with last use 2 weeks ago. Patient reported no current or past drug use.   Past Psychiatric History:   Previous psychological history is significant for loss of her father Outpatient Providers: patient reported a history of individual therapy through hospice after her father's death in Jun 23, 2011 History of Psych Hospitalization: No  Psychological Testing:  none    Abuse History:  Victim of: No.,  none    Report needed: No. Victim of Neglect:No. Perpetrator of  none reported   Witness / Exposure to Domestic Violence: No   Protective Services Involvement: No  Witness to [employer] Violence:  No   Family History:  Family History  Problem Relation Age of Onset   Hypertension Mother    Hyperlipidemia Father    Alzheimer's disease Father    Glaucoma Father    Other Brother        suicide, overdose   Stroke Paternal Grandmother    Breast cancer Maternal Aunt    Breast cancer Paternal Aunt     Living situation: the patient lives with their spouse  Sexual Orientation: Straight  Relationship Status: married  Name of spouse / other: Faylene Million If a parent, number of children / ages: 1 daughter age 57  Support Systems: spouse Friends mother daughter  Surveyor, quantity Stress:  No   Income/Employment/Disability: Employment  Financial planner:  No   Educational History: Education:  associates degree  Religion/Sprituality/World View: Baptist  Any cultural differences that may affect / interfere with treatment:  not applicable   Recreation/Hobbies: shopping, spending time with husband, taking trips, being around family  Stressors: Health problems    Strengths: Supportive Relationships, Family, and  Friends  Barriers:  Patient reported health concerns   Legal History: Pending legal issue / charges: The patient has no significant history of legal issues. History of legal issue / charges:  none  Medical History/Surgical History: reviewed Past Medical History:  Diagnosis Date   Anemia    Arthritis    Back pain    Bilateral swelling of feet    Chronic back pain    reason unknown but thinks related to knees   Gallbladder problem    Headache(784.0)    HTN (hypertension)    takes Diovan HCT daily   Hypothyroid    takes Synthroid daily   Joint pain    Joint pain    Joint swelling    Osteoarthritis    Rheumatoid arthritis (HCC)    Vitamin D deficiency   05/29/22 Patient reported a diagnosis of PMR (polymyalgia rheumatica)  Past Surgical History:  Procedure Laterality Date   ABDOMINAL HYSTERECTOMY     CESAREAN SECTION     GALLBLADDER SURGERY     KNEE ARTHROSCOPY Bilateral    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     folic acid (FOLVITE) 0.5 MG tablet Take 0.5 mg by mouth daily.     gabapentin (NEURONTIN) 300 MG capsule Take 300 mg by mouth at bedtime.     levothyroxine (SYNTHROID) 100 MCG tablet Take 1 tablet (100 mcg total) by mouth daily. 90 tablet 3   linaclotide (LINZESS) 290 MCG CAPS capsule Take 1 capsule (290 mcg total) by mouth daily before breakfast. 90 capsule 1   methotrexate (RHEUMATREX) 2.5 MG tablet TAKE 8 TABLETS BY MOUTH ONCE WEEKLY( TAKE ALL AT ONCE)     Multiple Vitamin (MULTIVITAMIN) tablet Take 1 tablet by mouth daily.     valsartan-hydrochlorothiazide (DIOVAN-HCT) 320-25 MG tablet Take 1 tablet by  mouth daily. 90 tablet 3   No current facility-administered medications for this visit.  05/29/22 Patient reported she is not currently taking folic acid, linzess, or methotrexate. Patient reported she is now taking kevzara injection.   No Known Allergies  Diagnoses:  Depression, unspecified depression type  Plan of Care: Patient is a 57 year old female who presented for an initial assessment. Clinician conducted initial assessment via caregility video from clinician's home office. Patient provided verbal consent to proceed with telehealth session and participated in session from patient's home. Patient stated, "when I was taking to my doctor, we were discussing getting my mental state back together and trying to get on track to lose weight" when clinician inquired about reason for today's visit. Patient reported the following symptoms: lack of motivation, fatigue, difficulty staying asleep, increased appetite, weight gain, "blah" mood, frustration and anger regarding weight loss, and decreased self esteem. Patient reported symptoms started approximately 2 years ago when patient started taking steroids. Patient denied current and past  suicidal ideation, homicidal ideation, and symptoms of psychosis. Patient reported she currently smokes tobacco, 6-7 cigarettes, daily, with last use today. Patient reported she currently drinks alcohol, 1-2 mixed drinks on Saturdays, with last use 2 weeks ago. Patient reported no current or past drug use. Patient reported a history of individual therapy with a hospice provider after the loss of her father. Patient reported no history of psychiatric hospitalizations. Patient reported current medical conditions are a stressor and prevents patient from participating in activities. Patient identified her husband, daughter, mother, and friends as current supports. It is recommended patient participate in individual therapy. Clinician will review recommendations and treatment plan  with patient during follow up appointment.  Collaboration of Care: Other Patient reported she would like to sign a consent form for her husband. Patient declined consent for other providers.    Patient/Guardian was advised Release of Information must be obtained prior to any record release in order to collaborate their care with an outside provider. Patient/Guardian was advised if they have not already done so to contact the registration department to sign all necessary forms in order for [place] to release information regarding their care.   Consent: Patient/Guardian gives verbal consent for treatment and assignment of benefits for services provided during this visit. Patient/Guardian expressed understanding and agreed to proceed.   Doree Barthel, LCSW

## 2022-06-01 ENCOUNTER — Other Ambulatory Visit (HOSPITAL_COMMUNITY): Payer: Self-pay

## 2022-06-12 ENCOUNTER — Telehealth: Payer: Self-pay

## 2022-06-12 DIAGNOSIS — E039 Hypothyroidism, unspecified: Secondary | ICD-10-CM

## 2022-06-12 MED ORDER — LEVOTHYROXINE SODIUM 100 MCG PO TABS
100.0000 ug | ORAL_TABLET | Freq: Every day | ORAL | 3 refills | Status: DC
Start: 2022-06-12 — End: 2022-09-25

## 2022-06-12 NOTE — Telephone Encounter (Signed)
Request to send medication to [facility].   levothyroxine (SYNTHROID) 100 MCG tablet

## 2022-06-26 ENCOUNTER — Ambulatory Visit: Payer: 59 | Admitting: Clinical

## 2022-07-10 ENCOUNTER — Encounter: Payer: Self-pay | Admitting: Family

## 2022-07-10 DIAGNOSIS — K5909 Other constipation: Secondary | ICD-10-CM

## 2022-07-10 NOTE — Telephone Encounter (Signed)
Does patient need an office visit, or can she be referred to another GI? Please see patients question.

## 2022-07-13 ENCOUNTER — Encounter: Payer: 59 | Admitting: Family

## 2022-07-24 ENCOUNTER — Ambulatory Visit (INDEPENDENT_AMBULATORY_CARE_PROVIDER_SITE_OTHER): Payer: 59 | Admitting: Clinical

## 2022-07-24 DIAGNOSIS — F32A Depression, unspecified: Secondary | ICD-10-CM

## 2022-07-24 NOTE — Progress Notes (Signed)
[facility] Counselor/Therapist Progress Note  Patient ID: [name] Hughes, MRN: [id number]    Date: 07/24/22  Time Spent: 10:33  am - 11:25 am : 52 Minutes  Treatment Type: Individual Therapy.  Reported Symptoms: Patient reported difficulty falling asleep last night and reported she frequently experiences difficulty staying asleep  Mental Status Exam: Appearance:  Neat and Well Groomed     Behavior: Drowsy  Motor: Normal  Speech/Language:  Clear and Coherent  Affect: Appropriate  Mood: normal  Thought process: normal  Thought content:   WNL  Sensory/Perceptual disturbances:   WNL  Orientation: oriented to person, place, and situation  Attention: Good  Concentration: Good  Memory: WNL  Fund of knowledge:  Good  Insight:   Good  Judgment:  Good  Impulse Control: Good   Risk Assessment: Danger to Self:  No Patient denied current suicidal ideation  Self-injurious Behavior: No Danger to Others: No Patient denied current homicidal ideation Duty to Warn:no Physical Aggression / Violence:No  Access to Firearms a concern: No  Gang Involvement:No   Subjective:  Patient stated, "probably the same" in response to symptoms since last session. Patient reported "a little bit better" in response to mood since last session. Patient reported a recent medication change for treatment of polymyalgia rheumatic. Patient reported an increase in pain after changes in medication. Patient reported the diagnosis of polymyalgia "slowed me down" but reported symptom have improved since recent change in medication.  Patient stated, "I have been diagnosed before with depression" and reported she was diagnosed with depression after her father passed away. Patient reported her father had a diagnosis of Alzheimer's Disease. Patient reported she is open to participation in therapy.   Interventions: Motivational Interviewing. Clinician conducted session via caregility video from clinician's home  office. Patient provided verbal consent to proceed with telehealth session and is aware of limitations of telephone or video visits. Patient participated in session from patient's home. Clinician reviewed diagnosis and treatment recommendations. Provided psycho education related to diagnosis and treatment.   Collaboration of Care: Other not required at this time  Diagnosis:  Depression, unspecified depression type   Plan: Goals to be developed during follow up appointment on August 03, 2022.             Doree Barthel, LCSW

## 2022-08-03 ENCOUNTER — Ambulatory Visit (INDEPENDENT_AMBULATORY_CARE_PROVIDER_SITE_OTHER): Payer: 59 | Admitting: Clinical

## 2022-08-03 DIAGNOSIS — F32A Depression, unspecified: Secondary | ICD-10-CM | POA: Diagnosis not present

## 2022-08-03 NOTE — Progress Notes (Signed)
[facility] Counselor/Therapist Progress Note  Patient ID: [name] Hughes, MRN: [id number]    Date: 08/03/22  Time Spent: 1:33  pm - 2:34 pm : 61 Minutes  Treatment Type: Individual Therapy.  Reported Symptoms: Patient reported decreased energy and motivation.   Mental Status Exam: Appearance:  Neat and Well Groomed     Behavior: Appropriate  Motor: Normal  Speech/Language:  Clear and Coherent  Affect: Appropriate  Mood: normal  Thought process: normal  Thought content:   WNL  Sensory/Perceptual disturbances:   WNL  Orientation: oriented to person, place, and situation  Attention: Good  Concentration: Good  Memory: WNL  Fund of knowledge:  Good  Insight:   Good  Judgment:  Good  Impulse Control: Good   Risk Assessment: Danger to Self:  No Patient denied current suicidal ideation  Self-injurious Behavior: No Danger to Others: No Patient denied current homicidal ideation Duty to Warn:no Physical Aggression / Violence:No  Access to Firearms a concern: No  Gang Involvement:No   Subjective:  Patient reported no changes since last session. Patient stated, "I'm still just trying to wrap around what I need to do to get back to myself". Patient reported she would like to regain energy and motivation to participate in activities. 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 Patient reported she has been working remotely since the start of the COVID pandemic.  Patient reported prior to the pandemic she was going to the gym and stated she was "highly motivated", "had a better mood". Patient stated,  "I don't like what I see", "I don't feel good about how I look in my clothes". Patient reported she experienced weight gain as a result of taking steroids for treatment of medical conditions. Patient stated, "I'm ok today" in response to patient's current mood.   Interventions: Motivational Interviewing.  Clinician conducted session via caregility video from clinician's home office. Patient provided verbal consent to proceed with telehealth session and is aware of limitations of telephone or video visits. Patient participated in session from patient's home office. Clinician utilized motivational interviewing to explore potential goals for therapy. Clinician utilized a task centered approach in collaboration with patient to develop goals for therapy. Patient participated in development of goals and agreed to goals for therapy. Clinician requested patient complete a thought record for homework.   Collaboration of Care: Other not required at this time   Diagnosis:  Depression, unspecified depression type   Plan: Patient is to utilize [employer] [facility], thought re-framing, behavioral activation, and coping strategies to decrease symptoms associated with their diagnosis. Frequency: bi-weekly  Modality: individual     Long-term goal:   Reduce overall level, frequency, and intensity of the feelings of depression as evidenced by decreased lack of motivation, fatigue, difficulty staying asleep, increased appetite, weight gain, "blah" mood, frustration and anger regarding weight loss, and decreased self esteem from 7 days/week to 0 to 1 days/week per patient report for at least 3 consecutive months. Target Date: 08/03/23  Progress: 0   Short-term goal:  patient will verbalize positive statements regarding self  Target Date: 02/03/23  Progress: 0   Identify, challenge, and replace negative core beliefs, thought patterns, and negative self talk that contribute to feelings of depression and low self esteem with positive thoughts, beliefs, and positive self talk per patient's report Target Date: 02/03/23  Progress: 0  Naysa Puskas, LCSW 

## 2022-08-26 NOTE — Progress Notes (Signed)
Heart rate   Celso Amy, PA-C [address]  [address]  [place], [place] [address]  Main: [phone]  Fax: [phone]   Gastroenterology Consultation  Referring Provider:     Mort Sawyers, FNP Primary Care Physician:  Mort Sawyers, FNP Primary Gastroenterologist:  Celso Amy, PA-C  Reason for Consultation:     Abdominal Pain, GERD, Constipation        HPI:   [name] Hughes is a 57 y.o. y/o female referred for consultation & management  by Mort Sawyers, FNP.  Here to evaluate abdominal pain.  Patient states she is having pain in the epigastrium and left upper quadrant.  Intermittently for 1 year.  It comes and goes.  She tried IBgard with little benefit.  Tried hyoscyamine in the past which helped, not currently taking.  She is taking a probiotic daily.  She tried Linzess in the past but has not been on that in a year.  Currently having a bowel movement daily.  She has a feeling of incomplete bowel evacuation.  Sits on the toilet for a long time.  Denies rectal bleeding or weight loss.  Has history of acid reflux with belching, gas, and bloating.  She was on pantoprazole in the past but not recently.  She states she has not taken pantoprazole in a year.  No current GERD treatment.  Patient last saw Dr. Marca Ancona at [facility] 07/08/2022.  Reports having previous colonoscopy 3 or 4 years ago.  Does not recall if she had a EGD.  I am requesting GI records.  Patient is requesting to transfer care to our office.  Medical history significant for GERD, hypertension, RA, hypothyroidism.  Previous cholecystectomy and C-section and total abdominal hysterectomy.  Past Medical History:  Diagnosis Date   Anemia    Arthritis    Back pain    Bilateral swelling of feet    Chronic back pain    reason unknown but thinks related to knees   Gallbladder problem    Headache(784.0)    HTN (hypertension)    takes Diovan HCT daily   Hypothyroid    takes Synthroid daily   Joint pain    Joint  pain    Joint swelling    Osteoarthritis    Rheumatoid arthritis (HCC)    Vitamin D deficiency     Past Surgical History:  Procedure Laterality Date   ABDOMINAL HYSTERECTOMY     CESAREAN SECTION     GALLBLADDER SURGERY     KNEE ARTHROSCOPY Bilateral    TOTAL KNEE ARTHROPLASTY Left 10/06/2013   dr graves   TOTAL KNEE ARTHROPLASTY Left 10/06/2013   Procedure: LEFT TOTAL KNEE ARTHROPLASTY;  Surgeon: Harvie Junior, MD;  Location: [facility] OR;  Service: Orthopedics;  Laterality: Left;    Prior to Admission medications   Medication Sig Start Date End Date Taking? Authorizing Provider  acetaminophen (TYLENOL) 500 MG tablet Take 500 mg by mouth every 6 (six) hours as needed.    [provider]  folic acid (FOLVITE) 0.5 MG tablet Take 0.5 mg by mouth daily.    [provider]  gabapentin (NEURONTIN) 300 MG capsule Take 300 mg by mouth at bedtime. 06/21/21   [provider]  levothyroxine (SYNTHROID) 100 MCG tablet Take 1 tablet (100 mcg total) by mouth daily. 06/12/22   Mort Sawyers, FNP  methotrexate (RHEUMATREX) 2.5 MG tablet TAKE 8 TABLETS BY MOUTH ONCE WEEKLY( TAKE ALL AT ONCE) 04/02/20   [provider]  Multiple Vitamin (  MULTIVITAMIN) tablet Take 1 tablet by mouth daily.    [provider]  valsartan-hydrochlorothiazide (DIOVAN-HCT) 320-25 MG tablet Take 1 tablet by mouth daily. 04/10/22   Mort Sawyers, FNP    Family History  Problem Relation Age of Onset   Hypertension Mother    Hyperlipidemia Father    Alzheimer's disease Father    Glaucoma Father    Other Brother        suicide, overdose   Stroke Paternal Grandmother    Breast cancer Maternal Aunt    Breast cancer Paternal Aunt      Social History   Tobacco Use   Smoking status: Every Day    Current packs/day: 0.50    Average packs/day: 0.5 packs/day for 20.0 years (10.0 ttl pk-yrs)    Types: Cigarettes   Smokeless tobacco: Never  Vaping Use   Vaping status: Never Used  Substance  Use Topics   Alcohol use: Yes    Comment: occasionally   Drug use: No    Allergies as of 08/28/2022   (No Known Allergies)    Review of Systems:    All systems reviewed and negative except where noted in HPI.   Physical Exam:  BP 110/67   Pulse (!) 58   Temp 98.1 F (36.7 C)   Ht 5' (1.524 m)   Wt 293 lb 12.8 oz (133.3 kg)   BMI 57.38 kg/m  No LMP recorded. Patient has had a hysterectomy. Psych:  Alert and cooperative. Normal mood and affect. General:   Alert,  Well-developed, well-nourished, pleasant and cooperative in NAD Head:  Normocephalic and atraumatic. Eyes:  Sclera clear, no icterus.   Conjunctiva pink. Lungs:  Respirations even and unlabored.  Clear throughout to auscultation.   No wheezes, crackles, or rhonchi. No acute distress. Heart:  Regular rate and rhythm; no murmurs, clicks, rubs, or gallops. Abdomen:  Normal bowel sounds.  No bruits.  Soft, and obese without masses, hepatosplenomegaly or hernias noted.  Mild LUQ and Epigastric  Tenderness.  No lower abdominal tenderness.  No guarding or rebound tenderness.    Neurologic:  Alert and oriented x3;  grossly normal neurologically. Psych:  Alert and cooperative. Normal mood and affect.  Imaging Studies: No results found.  Assessment and Plan:   [name] is a 57 y.o. y/o female has been referred for   1.  Upper abdominal pain: Epigastric and LUQ  H. pylori breath test  Get all GI records from [facility], Dr. Marca Ancona in [facility]  Check labs CBC, CMP, lipase  2.  Constipation  Start MiraLAX powder mix 1 capful in a drink once daily  Continue probiotic daily  3.  GERD  Start pantoprazole 40 Mg once daily after she has completed H. pylori test  4.  Anemia - chronic  Labs: CBC, iron panel, ferritin, B12, folate  Pending lab results and review of previous GI records, then we can decide if we need to schedule EGD and colonoscopy.  Follow up in 4 weeks with TG.  Celso Amy, PA-C

## 2022-08-28 ENCOUNTER — Encounter: Payer: Self-pay | Admitting: Physician Assistant

## 2022-08-28 ENCOUNTER — Ambulatory Visit (INDEPENDENT_AMBULATORY_CARE_PROVIDER_SITE_OTHER): Payer: 59 | Admitting: Physician Assistant

## 2022-08-28 VITALS — BP 110/67 | HR 58 | Temp 98.1°F | Ht 60.0 in | Wt 293.8 lb

## 2022-08-28 DIAGNOSIS — R1012 Left upper quadrant pain: Secondary | ICD-10-CM

## 2022-08-28 DIAGNOSIS — R1013 Epigastric pain: Secondary | ICD-10-CM | POA: Diagnosis not present

## 2022-08-28 DIAGNOSIS — K219 Gastro-esophageal reflux disease without esophagitis: Secondary | ICD-10-CM | POA: Diagnosis not present

## 2022-08-28 DIAGNOSIS — D649 Anemia, unspecified: Secondary | ICD-10-CM

## 2022-08-28 DIAGNOSIS — K5904 Chronic idiopathic constipation: Secondary | ICD-10-CM

## 2022-08-28 MED ORDER — PANTOPRAZOLE SODIUM 40 MG PO TBEC: 40.0000 mg | DELAYED_RELEASE_TABLET | Freq: Every day | ORAL | 5 refills | Status: DC

## 2022-08-28 MED ORDER — POLYETHYLENE GLYCOL 3350 17 GM/SCOOP PO POWD
ORAL | Status: AC
Start: 2022-08-28 — End: ?

## 2022-08-30 NOTE — Progress Notes (Signed)
Notify pt.: 1. White count is mildly elevated.  Has she been on Prednisone recently?  Prednisone and infection can cause elevated white count.   If she is having any symptoms of infection (fever, cough, urinary sx), then she should f/u with her PCP. 2.  Hemoglobin has returned to normal = 13g.  NO current anemia. 3.  Vitamin B12, folate, and iron are normal. 4.  Lipase pancreas test is mildly low, yet not worrisome.  No evidence of pancreatitis.   5.  Kidney test and electrolytes are normal. 6.  1 liver test (ALT) is slightly elevated.  I recommend order complete abdominal ultrasound, diagnosis elevated LFT and upper abdominal pain. 7.  H. pylori breath test is negative.

## 2022-08-31 ENCOUNTER — Telehealth: Payer: Self-pay

## 2022-08-31 ENCOUNTER — Other Ambulatory Visit: Payer: Self-pay | Admitting: Physician Assistant

## 2022-08-31 DIAGNOSIS — R1013 Epigastric pain: Secondary | ICD-10-CM

## 2022-08-31 DIAGNOSIS — R109 Unspecified abdominal pain: Secondary | ICD-10-CM

## 2022-08-31 NOTE — Telephone Encounter (Signed)
Ultrasound scheduled @ [facility] 09/03/22 @ 9:15 arrival.    Patient will call and reschedule Ultrasound due to work hours. Scheduling number provided.    White count is mildly elevated.  Has she been on Prednisone recently?  Prednisone and infection can cause elevated white count.   If she is having any symptoms of infection (fever, cough, urinary sx), then she should f/u with her PCP.  2.  Hemoglobin has returned to normal = 13g.  NO current anemia.  3.  Vitamin B12, folate, and iron are normal.  4.  Lipase pancreas test is mildly low, yet not worrisome.  No evidence of pancreatitis.   5.  Kidney test and electrolytes are normal.  6.  1 liver test (ALT) is slightly elevated.  I recommend order complete abdominal ultrasound, diagnosis elevated LFT and upper abdominal pain.  7.  H. pylori breath test is negative.

## 2022-09-02 LAB — HM MAMMOGRAPHY

## 2022-09-03 ENCOUNTER — Other Ambulatory Visit: Payer: 59

## 2022-09-07 ENCOUNTER — Ambulatory Visit: Payer: 59 | Admitting: Clinical

## 2022-09-08 ENCOUNTER — Ambulatory Visit
Admission: RE | Admit: 2022-09-08 | Discharge: 2022-09-08 | Disposition: A | Discharge status: Home / Self Care | Payer: 59 | Source: Ambulatory Visit | Attending: Physician Assistant | Admitting: Physician Assistant

## 2022-09-08 DIAGNOSIS — R109 Unspecified abdominal pain: Secondary | ICD-10-CM | POA: Insufficient documentation

## 2022-09-15 ENCOUNTER — Other Ambulatory Visit: Payer: Self-pay

## 2022-09-18 ENCOUNTER — Telehealth: Payer: Self-pay | Admitting: Family

## 2022-09-23 ENCOUNTER — Encounter: Payer: Self-pay | Admitting: Family

## 2022-09-23 DIAGNOSIS — E039 Hypothyroidism, unspecified: Secondary | ICD-10-CM

## 2022-09-25 ENCOUNTER — Telehealth: Payer: Self-pay | Admitting: Family

## 2022-09-25 DIAGNOSIS — E039 Hypothyroidism, unspecified: Secondary | ICD-10-CM

## 2022-09-25 MED ORDER — LEVOTHYROXINE SODIUM 100 MCG PO TABS
100.0000 ug | ORAL_TABLET | Freq: Every day | ORAL | 3 refills | Status: DC
Start: 2022-09-25 — End: 2022-09-28

## 2022-09-25 NOTE — Telephone Encounter (Signed)
Patient called in regarding her medication  levothyroxine (SYNTHROID) 100 MCG table. She stated [employer] informed her the manufacture changed from Amneal to lupin. They are needing approval from tabitha. The number is [id number].

## 2022-09-28 MED ORDER — LEVOTHYROXINE SODIUM 100 MCG PO TABS
100.0000 ug | ORAL_TABLET | Freq: Every day | ORAL | 3 refills | Status: DC
Start: 2022-09-28 — End: 2023-10-12

## 2022-09-28 NOTE — Telephone Encounter (Signed)
Change in manufacturer ok. New RX sent in with notation.

## 2022-09-29 ENCOUNTER — Encounter: Payer: Self-pay | Admitting: Physician Assistant

## 2022-10-02 ENCOUNTER — Ambulatory Visit: Payer: 59 | Admitting: Clinical

## 2022-10-21 ENCOUNTER — Ambulatory Visit: Payer: 59 | Admitting: Physician Assistant

## 2023-01-04 ENCOUNTER — Other Ambulatory Visit: Payer: Self-pay | Admitting: Family

## 2023-01-04 ENCOUNTER — Encounter: Payer: Self-pay | Admitting: Family

## 2023-01-04 ENCOUNTER — Other Ambulatory Visit (HOSPITAL_COMMUNITY): Payer: Self-pay

## 2023-01-04 ENCOUNTER — Ambulatory Visit (INDEPENDENT_AMBULATORY_CARE_PROVIDER_SITE_OTHER): Payer: 59 | Admitting: Family

## 2023-01-04 VITALS — BP 118/80 | HR 87 | Temp 97.9°F | Ht 66.0 in | Wt 284.0 lb

## 2023-01-04 DIAGNOSIS — D72829 Elevated white blood cell count, unspecified: Secondary | ICD-10-CM

## 2023-01-04 DIAGNOSIS — Z0000 Encounter for general adult medical examination without abnormal findings: Secondary | ICD-10-CM | POA: Insufficient documentation

## 2023-01-04 DIAGNOSIS — E7800 Pure hypercholesterolemia, unspecified: Secondary | ICD-10-CM

## 2023-01-04 DIAGNOSIS — F50811 Binge eating disorder, moderate: Secondary | ICD-10-CM

## 2023-01-04 DIAGNOSIS — I10 Essential (primary) hypertension: Secondary | ICD-10-CM

## 2023-01-04 DIAGNOSIS — E039 Hypothyroidism, unspecified: Secondary | ICD-10-CM

## 2023-01-04 DIAGNOSIS — M0579 Rheumatoid arthritis with rheumatoid factor of multiple sites without organ or systems involvement: Secondary | ICD-10-CM | POA: Diagnosis not present

## 2023-01-04 DIAGNOSIS — Z131 Encounter for screening for diabetes mellitus: Secondary | ICD-10-CM | POA: Diagnosis not present

## 2023-01-04 DIAGNOSIS — R944 Abnormal results of kidney function studies: Secondary | ICD-10-CM

## 2023-01-04 DIAGNOSIS — R7989 Other specified abnormal findings of blood chemistry: Secondary | ICD-10-CM

## 2023-01-04 DIAGNOSIS — M150 Primary generalized (osteo)arthritis: Secondary | ICD-10-CM

## 2023-01-04 DIAGNOSIS — Z720 Tobacco use: Secondary | ICD-10-CM

## 2023-01-04 LAB — CBC WITH DIFFERENTIAL/PLATELET
Basophils Absolute: 0 10*3/uL (ref 0.0–0.1)
Basophils Relative: 0.5 % (ref 0.0–3.0)
Eosinophils Absolute: 0.4 10*3/uL (ref 0.0–0.7)
Eosinophils Relative: 7.3 % — ABNORMAL HIGH (ref 0.0–5.0)
HCT: 36.6 % (ref 36.0–46.0)
Hemoglobin: 12 g/dL (ref 12.0–15.0)
Lymphocytes Relative: 28.1 % (ref 12.0–46.0)
Lymphs Abs: 1.7 10*3/uL (ref 0.7–4.0)
MCHC: 32.6 g/dL (ref 30.0–36.0)
MCV: 92 fL (ref 78.0–100.0)
Monocytes Absolute: 0.6 10*3/uL (ref 0.1–1.0)
Monocytes Relative: 10.3 % (ref 3.0–12.0)
Neutro Abs: 3.3 10*3/uL (ref 1.4–7.7)
Neutrophils Relative %: 53.8 % (ref 43.0–77.0)
Platelets: 179 10*3/uL (ref 150.0–400.0)
RBC: 3.98 Mil/uL (ref 3.87–5.11)
RDW: 12.7 % (ref 11.5–15.5)
WBC: 6.1 10*3/uL (ref 4.0–10.5)

## 2023-01-04 LAB — LIPID PANEL
Cholesterol: 219 mg/dL — ABNORMAL HIGH (ref 0–200)
HDL: 48.9 mg/dL (ref >39.00)
LDL Cholesterol: 149 mg/dL — ABNORMAL HIGH (ref 0–99)
NonHDL: 170.12
Total CHOL/HDL Ratio: 4
Triglycerides: 107 mg/dL (ref 0.0–149.0)
VLDL: 21.4 mg/dL (ref 0.0–40.0)

## 2023-01-04 LAB — COMPREHENSIVE METABOLIC PANEL
ALT: 22 U/L (ref 0–35)
AST: 18 U/L (ref 0–37)
Albumin: 3.6 g/dL (ref 3.5–5.2)
Alkaline Phosphatase: 63 U/L (ref 39–117)
BUN: 13 mg/dL (ref 6–23)
CO2: 30 meq/L (ref 19–32)
Calcium: 9.1 mg/dL (ref 8.4–10.5)
Chloride: 106 meq/L (ref 96–112)
Creatinine, Ser: 1.06 mg/dL (ref 0.40–1.20)
GFR: 58.3 mL/min — ABNORMAL LOW (ref >60.00)
Glucose, Bld: 95 mg/dL (ref 70–99)
Potassium: 3.8 meq/L (ref 3.5–5.1)
Sodium: 144 meq/L (ref 135–145)
Total Bilirubin: 0.7 mg/dL (ref 0.2–1.2)
Total Protein: 6 g/dL (ref 6.0–8.3)

## 2023-01-04 LAB — TSH: TSH: 0.59 u[IU]/mL (ref 0.35–5.50)

## 2023-01-04 LAB — HEMOGLOBIN A1C: Hgb A1c MFr Bld: 5.3 % (ref 4.6–6.5)

## 2023-01-04 MED ORDER — ZEPBOUND 7.5 MG/0.5ML ~~LOC~~ SOAJ: 7.5000 mg | SUBCUTANEOUS | 0 refills | Status: DC

## 2023-01-04 MED ORDER — NICOTINE 14 MG/24HR TD PT24: 14.0000 mg | MEDICATED_PATCH | Freq: Every day | TRANSDERMAL | 0 refills | Status: DC

## 2023-01-04 MED ORDER — ATORVASTATIN CALCIUM 10 MG PO TABS: 10.0000 mg | ORAL_TABLET | Freq: Every day | ORAL | 3 refills | Status: DC

## 2023-01-04 MED ORDER — NICOTINE 14 MG/24HR TD PT24: 14.0000 mg | MEDICATED_PATCH | Freq: Every day | TRANSDERMAL | 2 refills | Status: DC

## 2023-01-04 MED ORDER — ZEPBOUND 5 MG/0.5ML ~~LOC~~ SOAJ: 5.0000 mg | SUBCUTANEOUS | 0 refills | Status: DC

## 2023-01-04 MED ORDER — ZEPBOUND 2.5 MG/0.5ML ~~LOC~~ SOAJ: 2.5000 mg | SUBCUTANEOUS | 0 refills | Status: DC

## 2023-01-04 NOTE — Assessment & Plan Note (Signed)
Smoking cessation instruction/counseling given:  commended patient for quitting and reviewed strategies for preventing relapses Referral to lung cancer screening program made. Pt aggreable to nicotine patch

## 2023-01-04 NOTE — Progress Notes (Signed)
Is she having any urinary symptoms/  Has she been drinking water? Have her bring by urine sample if she is able (order in)  Cholesterol has also increased significantly. We should really consider starting a cholesterol medication, LDL is in the 140's goal is less than 70. Would she be willing?  Not prediabetic so that is good!

## 2023-01-04 NOTE — Assessment & Plan Note (Signed)
Continue diovan  Pt advised of the following:  Continue medication as prescribed. Monitor blood pressure periodically and/or when you feel symptomatic. Goal is <130/90 on average. Ensure that you have rested for 30 minutes prior to checking your blood pressure. Record your readings and bring them to your next visit if necessary.work on a low sodium diet.

## 2023-01-04 NOTE — Patient Instructions (Addendum)
  Call to inquire about zepbound and or wegovy with your insurance.

## 2023-01-04 NOTE — Progress Notes (Signed)
Subjective:  Patient ID: [name] Hughes, female    DOB: November 24, 1965  Age: 57 y.o. MRN: [id number]  Patient Care Team: Mort Sawyers, FNP as PCP - General (Family Medicine) Donnetta Hail, MD as Consulting Physician (Rheumatology)   CC:  Chief Complaint  Patient presents with   Annual Exam    HPI [name] Hughes is a 57 y.o. female who presents today for an annual physical exam. She reports consuming a general diet. The patient does not participate in regular exercise at present. She generally feels well. She reports sleeping well. She does not have additional problems to discuss today.   Vision:Within last year Dental:Receives regular dental care  Lung Cancer Screening with low-dose Chest CT: currently smoking, restarted 2008 16 years. Smoked prior 1994, 10 years. Total pack 26 years - Adults age 19-80 who are current cigarette smokers or quit within the last 15 years. Must have 20 pack year history.   Mammogram: 09/02/22  Last pap: last one 2016 Colonoscopy: 2019  Pt is with acute concerns.  Obesity, has tried GLP 1 states was a compound with rheumatology. She states 'this did not work' and she said she got up the highest dose. She also states not covered by insurance. Has a lot tried weight loss and wellness, had issues with diet control because she binge eats when she is bored.   Advanced Directives Patient does not have advanced directives    DEPRESSION SCREENING    01/04/2023    9:08 AM 04/10/2022   11:59 AM 10/01/2021    8:24 AM 07/03/2021   11:00 AM  PHQ 2/9 Scores  PHQ - 2 Score 0 2 3 1   PHQ- 9 Score 2  10 9      ROS: Negative unless specifically indicated above in HPI.    Current Outpatient Medications:    acetaminophen (TYLENOL) 500 MG tablet, Take 500 mg by mouth every 6 (six) hours as needed., Disp: , Rfl:    gabapentin (NEURONTIN) 300 MG capsule, Take 300 mg by mouth at bedtime., Disp: , Rfl:    levothyroxine (SYNTHROID) 100 MCG tablet, Take 1 tablet (100 mcg  total) by mouth daily., Disp: 90 tablet, Rfl: 3   Multiple Vitamin (MULTIVITAMIN) tablet, Take 1 tablet by mouth daily., Disp: , Rfl:    pantoprazole (PROTONIX) 40 MG tablet, Take 1 tablet (40 mg total) by mouth daily., Disp: 30 tablet, Rfl: 5   polyethylene glycol powder (GLYCOLAX/MIRALAX) 17 GM/SCOOP powder, Mix 1 capful in a Drink Once daily for Constipation., Disp: , Rfl:    Sarilumab (KEVZARA [place]), Inject into the skin., Disp: , Rfl:    tirzepatide (ZEPBOUND) 2.5 MG/0.5ML Pen, Inject 2.5 mg into the skin once a week., Disp: 2 mL, Rfl: 0   [START ON 01/25/2023] tirzepatide (ZEPBOUND) 5 MG/0.5ML Pen, Inject 5 mg into the skin once a week., Disp: 2 mL, Rfl: 0   [START ON 02/15/2023] tirzepatide (ZEPBOUND) 7.5 MG/0.5ML Pen, Inject 7.5 mg into the skin once a week., Disp: 6 mL, Rfl: 0   valsartan-hydrochlorothiazide (DIOVAN-HCT) 320-25 MG tablet, Take 1 tablet by mouth daily., Disp: 90 tablet, Rfl: 3   nicotine (NICODERM CQ - DOSED IN MG/24 HOURS) 14 mg/24hr patch, Place 1 patch (14 mg total) onto the skin daily., Disp: 28 patch, Rfl: 2    Objective:    BP 118/80 (BP Location: Left Arm, Patient Position: Sitting, Cuff Size: Large)   Pulse 87   Temp 97.9 F (36.6 C) (Temporal)   Ht 5'  6" (1.676 m)   Wt 284 lb (128.8 kg)   SpO2 97%   BMI 45.84 kg/m   BP Readings from Last 3 Encounters:  01/04/23 118/80  08/28/22 110/67  04/10/22 124/76      Physical Exam Constitutional:      General: She is not in acute distress.    Appearance: Normal appearance. She is obese. She is not ill-appearing.  HENT:     Head: Normocephalic.     Right Ear: Tympanic membrane normal.     Left Ear: Tympanic membrane normal.     Nose: Nose normal.     Mouth/Throat:     Mouth: Mucous membranes are moist.  Eyes:     Extraocular Movements: Extraocular movements intact.     Pupils: Pupils are equal, round, and reactive to light.  Cardiovascular:     Rate and Rhythm: Normal rate and regular rhythm.   Pulmonary:     Effort: Pulmonary effort is normal.     Breath sounds: Normal breath sounds.  Abdominal:     General: Abdomen is flat. Bowel sounds are normal.     Palpations: Abdomen is soft.     Tenderness: There is no guarding or rebound.  Musculoskeletal:        General: Normal range of motion.     Cervical back: Normal range of motion.  Skin:    General: Skin is warm.     Capillary Refill: Capillary refill takes less than 2 seconds.  Neurological:     General: No focal deficit present.     Mental Status: She is alert.  Psychiatric:        Mood and Affect: Mood normal.        Behavior: Behavior normal.        Thought Content: Thought content normal.        Judgment: Judgment normal.          Assessment & Plan:  Acquired hypothyroidism Assessment & Plan: Educated pt on taking in am, prior to medications and food by at least 30 min to one hour. She has not been compliant with this as she had not been educated per her recollection.   Advised also to separate four hours from anti acid medications or vitamins.  Tsh ordered pending results.   Orders: -     TSH  Rheumatoid arthritis involving multiple sites with positive rheumatoid factor (HCC) Assessment & Plan: F/u with rheumatology as scheduled.    Severe obesity (BMI >= 40) (HCC) Assessment & Plan: Work on diet and exercise Trial zepbound  Orders: -     Zepbound; Inject 5 mg into the skin once a week.  Dispense: 2 mL; Refill: 0 -     Hemoglobin A1c  Primary osteoarthritis involving multiple joints  Moderate binge-eating disorder -     Zepbound; Inject 2.5 mg into the skin once a week.  Dispense: 2 mL; Refill: 0 -     Zepbound; Inject 7.5 mg into the skin once a week.  Dispense: 6 mL; Refill: 0  Elevated LFTs -     Comprehensive metabolic panel  Leukocytosis, unspecified type -     CBC with Differential/Platelet  Hypercholesteremia Assessment & Plan: Ordered lipid panel, pending results. Work on low  cholesterol diet and exercise as tolerated   Orders: -     Lipid panel  Tobacco abuse Assessment & Plan: Smoking cessation instruction/counseling given:  commended patient for quitting and reviewed strategies for preventing relapses Referral to lung cancer screening  program made. Pt aggreable to nicotine patch    Orders: -     Ambulatory Referral for Lung Cancer Scre -     Nicotine; Place 1 patch (14 mg total) onto the skin daily.  Dispense: 28 patch; Refill: 2  Primary hypertension Assessment & Plan: Continue diovan  Pt advised of the following:  Continue medication as prescribed. Monitor blood pressure periodically and/or when you feel symptomatic. Goal is <130/90 on average. Ensure that you have rested for 30 minutes prior to checking your blood pressure. Record your readings and bring them to your next visit if necessary.work on a low sodium diet.    Encounter for general adult medical examination without abnormal findings Assessment & Plan: Patient Counseling(The following topics were reviewed):  Preventative care handout given to pt  Health maintenance and immunizations reviewed. Please refer to Health maintenance section. Pt advised on safe sex, wearing seatbelts in car, and proper nutrition labwork ordered today for annual Dental health: Discussed importance of regular tooth brushing, flossing, and dental visits.   Orders: -     TSH -     Comprehensive metabolic panel -     CBC with Differential/Platelet -     Lipid panel -     Hemoglobin A1c -     Ambulatory Referral for Lung Cancer Scre      Follow-up: Return in about 6 weeks (around 02/15/2023) for follow up about six weeks after starting smoking cessation .   Mort Sawyers, FNP

## 2023-01-04 NOTE — Assessment & Plan Note (Signed)
Educated pt on taking in am, prior to medications and food by at least 30 min to one hour. She has not been compliant with this as she had not been educated per her recollection.   Advised also to separate four hours from anti acid medications or vitamins.  Tsh ordered pending results.

## 2023-01-04 NOTE — Assessment & Plan Note (Signed)
F/u with rheumatology as scheduled.

## 2023-01-04 NOTE — Assessment & Plan Note (Signed)

## 2023-01-04 NOTE — Assessment & Plan Note (Signed)
Ordered lipid panel, pending results. Work on low cholesterol diet and exercise as tolerated  

## 2023-01-04 NOTE — Assessment & Plan Note (Addendum)
Work on diet and exercise Trial zepbound

## 2023-01-15 ENCOUNTER — Telehealth: Payer: Self-pay

## 2023-01-15 ENCOUNTER — Other Ambulatory Visit (HOSPITAL_COMMUNITY): Payer: Self-pay

## 2023-01-15 ENCOUNTER — Other Ambulatory Visit (INDEPENDENT_AMBULATORY_CARE_PROVIDER_SITE_OTHER): Payer: 59

## 2023-01-15 DIAGNOSIS — E7800 Pure hypercholesterolemia, unspecified: Secondary | ICD-10-CM | POA: Diagnosis not present

## 2023-01-15 DIAGNOSIS — R944 Abnormal results of kidney function studies: Secondary | ICD-10-CM

## 2023-01-15 NOTE — Addendum Note (Signed)
Addended by: Alvina Chou on: 01/15/2023 07:58 AM   Modules accepted: Orders

## 2023-01-15 NOTE — Telephone Encounter (Signed)
*  Primary  Pharmacy Patient Advocate Encounter   Received notification from CoverMyMeds that prior authorization for Zepbound 2.5MG /0.5ML pen-injectors  is required/requested.   Insurance verification completed.   The patient is insured through [facility] .   Per test claim: PA required; PA submitted to above mentioned insurance via CoverMyMeds Key/confirmation #/EOC [id number] Status is pending

## 2023-01-16 LAB — URINE CULTURE
MICRO NUMBER:: 15848510
Result:: NO GROWTH
SPECIMEN QUALITY:: ADEQUATE

## 2023-01-17 ENCOUNTER — Encounter: Payer: Self-pay | Admitting: Family

## 2023-01-18 NOTE — Telephone Encounter (Signed)
Pharmacy Patient Advocate Encounter  Received notification from [facility] that Prior Authorization for  Zepbound 2.5MG /0.5ML pen-injectors  has been DENIED.  See denial reason below. No denial letter attached in CMM. Will attach denial letter to Media tab once received.   PA #/Case ID/Reference #: [id number]

## 2023-01-20 ENCOUNTER — Encounter: Payer: Self-pay | Admitting: Family

## 2023-02-17 ENCOUNTER — Encounter: Payer: Self-pay | Admitting: Family

## 2023-02-17 DIAGNOSIS — E7800 Pure hypercholesterolemia, unspecified: Secondary | ICD-10-CM

## 2023-02-18 ENCOUNTER — Other Ambulatory Visit: Payer: Self-pay | Admitting: *Deleted

## 2023-02-18 DIAGNOSIS — E7800 Pure hypercholesterolemia, unspecified: Secondary | ICD-10-CM

## 2023-02-18 MED ORDER — ROSUVASTATIN CALCIUM 5 MG PO TABS: 5.0000 mg | ORAL_TABLET | Freq: Every day | ORAL | 0 refills | Status: DC

## 2023-02-18 MED ORDER — ROSUVASTATIN CALCIUM 5 MG PO TABS: 5.0000 mg | ORAL_TABLET | Freq: Every day | ORAL | 3 refills | Status: DC

## 2023-04-07 ENCOUNTER — Encounter: Payer: Self-pay | Admitting: Family

## 2023-04-08 ENCOUNTER — Encounter: Payer: Self-pay | Admitting: Family

## 2023-04-09 ENCOUNTER — Other Ambulatory Visit: Payer: 59

## 2023-04-09 DIAGNOSIS — E7800 Pure hypercholesterolemia, unspecified: Secondary | ICD-10-CM

## 2023-04-09 DIAGNOSIS — R944 Abnormal results of kidney function studies: Secondary | ICD-10-CM

## 2023-04-09 LAB — LIPID PANEL
Cholesterol: 147 mg/dL (ref 0–200)
HDL: 49.2 mg/dL (ref >39.00)
LDL Cholesterol: 85 mg/dL (ref 0–99)
NonHDL: 97.81
Total CHOL/HDL Ratio: 3
Triglycerides: 64 mg/dL (ref 0.0–149.0)
VLDL: 12.8 mg/dL (ref 0.0–40.0)

## 2023-04-12 ENCOUNTER — Encounter: Payer: Self-pay | Admitting: Family

## 2023-10-12 ENCOUNTER — Other Ambulatory Visit: Payer: Self-pay | Admitting: Family

## 2023-10-12 DIAGNOSIS — E039 Hypothyroidism, unspecified: Secondary | ICD-10-CM

## 2023-10-13 ENCOUNTER — Encounter: Payer: Self-pay | Admitting: Podiatry

## 2023-10-13 ENCOUNTER — Ambulatory Visit (INDEPENDENT_AMBULATORY_CARE_PROVIDER_SITE_OTHER)

## 2023-10-13 ENCOUNTER — Ambulatory Visit (INDEPENDENT_AMBULATORY_CARE_PROVIDER_SITE_OTHER): Admitting: Podiatry

## 2023-10-13 DIAGNOSIS — M76821 Posterior tibial tendinitis, right leg: Secondary | ICD-10-CM | POA: Diagnosis not present

## 2023-10-13 DIAGNOSIS — M76822 Posterior tibial tendinitis, left leg: Secondary | ICD-10-CM

## 2023-10-13 DIAGNOSIS — M19272 Secondary osteoarthritis, left ankle and foot: Secondary | ICD-10-CM

## 2023-10-13 DIAGNOSIS — M2142 Flat foot [pes planus] (acquired), left foot: Secondary | ICD-10-CM

## 2023-10-13 DIAGNOSIS — M2141 Flat foot [pes planus] (acquired), right foot: Secondary | ICD-10-CM

## 2023-10-13 NOTE — Progress Notes (Signed)
  Subjective:  Patient ID: [name] Hughes, female    DOB: 09/05/1965,  MRN: [id number]  Chief Complaint  Patient presents with   Foot Pain    Right foot pain. Pain is located on the top of the foot and the arch of the foot. OTC medicines and soaks have not helped.    Discussed the use of AI scribe software for clinical note transcription with the patient, who gave verbal consent to proceed.  History of Present Illness [name] Hughes is a 58 year old female with rheumatoid arthritis and polymyalgia who presents with left foot pain.  She experiences pain on the top and side of her left foot, describing it as numb and cold, particularly at night. The pain is more pronounced on the instep and is exacerbated by walking or flexing her foot.  She has a history of rheumatoid arthritis and polymyalgia, which she believes may be contributing to her symptoms. Additionally, she mentions sciatica, which she thinks might be causing numbness in her heel and affecting her left side.  She does not currently use any insoles in her shoes. She has tried [employer] but found them uncomfortable.  Her mother does not have arthritis, and she is unsure about the hereditary aspect of her condition.      Objective:    Physical Exam VASCULAR: DP and PT pulse palpable. Foot is warm and well-perfused. Capillary fill time is brisk. DERMATOLOGIC: Normal skin turgor, texture, and temperature. No open lesions, rashes, or ulcerations. NEUROLOGIC: Normal sensation to light touch and pressure. No paresthesias. ORTHOPEDIC: Pain on palpation of dorsal midfoot. Pes planus deformity. No edema, swelling, or ecchymosis. Smooth pain-free range of motion of all examined joints. No gross deformity.   No images are attached to the encounter.    Results RADIOLOGY Left foot radiograph: Mild degenerative changes in the second tarsometatarsal joint and midfoot. Pes planus deformity. (10/13/2023)   Assessment:   1. Other  secondary osteoarthritis of left foot   2. Pes planus of both feet   3. Posterior tibial tendon dysfunction (PTTD) of both lower extremities      Plan:  Patient was evaluated and treated and all questions answered.  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 The pain is associated with pes planus deformity and mild degenerative changes in the second tarsal metatarsal joint and midfoot, as seen on radiographs. The condition is likely exacerbated by her flat foot, which is hereditary, and possibly influenced by rheumatoid arthritis and polymyalgia. Sciatica may also contribute to the numbness. The osteoarthritis is secondary to the pes planus, which is common in individuals in their fifties and sixties. - Recommend use of insoles to support the arch and accommodate flat foot, potentially covered by insurance. - Refer to Lolita, the orthotist, for insole fitting and setup. - Advise use of Voltaren gel topically three to four times a day for pain management. - Discuss potential for steroid injection if pain does not improve with insoles and topical treatment. - Consider surgical intervention as a last resort if conservative measures fail.      Return if symptoms worsen or fail to improve.

## 2023-10-22 ENCOUNTER — Ambulatory Visit

## 2023-10-22 DIAGNOSIS — M76821 Posterior tibial tendinitis, right leg: Secondary | ICD-10-CM

## 2023-10-22 DIAGNOSIS — M2142 Flat foot [pes planus] (acquired), left foot: Secondary | ICD-10-CM

## 2023-10-25 NOTE — Progress Notes (Signed)
 Orthotics   Patient was present and evaluated for Custom molded foot orthotics. Patient will benefit from CFO's to provide total contact to BIL MLA's helping to balance and distribute body weight more evenly across BIL feet helping to reduce plantar pressure and pain. Orthotic will also encourage FF / RF alignment  Patient was scanned today and will return for fitting upon receipt

## 2023-11-18 ENCOUNTER — Telehealth: Payer: Self-pay

## 2023-11-18 NOTE — Telephone Encounter (Signed)
 Called to cancel orthotic pu appt. Spoke to patient I will call when orthotics are in (held at border)

## 2023-11-19 ENCOUNTER — Other Ambulatory Visit

## 2023-11-24 NOTE — Telephone Encounter (Signed)
 Orthotics are here Charges not entered yet Financial form signed and on file Appt needed for fitting/ pu

## 2023-11-26 NOTE — Telephone Encounter (Signed)
 Orthotics in [place] bin

## 2023-12-03 ENCOUNTER — Ambulatory Visit (INDEPENDENT_AMBULATORY_CARE_PROVIDER_SITE_OTHER): Admitting: Podiatry

## 2023-12-03 DIAGNOSIS — M2141 Flat foot [pes planus] (acquired), right foot: Secondary | ICD-10-CM | POA: Diagnosis not present

## 2023-12-03 DIAGNOSIS — M76822 Posterior tibial tendinitis, left leg: Secondary | ICD-10-CM

## 2023-12-03 DIAGNOSIS — M76821 Posterior tibial tendinitis, right leg: Secondary | ICD-10-CM

## 2023-12-03 DIAGNOSIS — M2142 Flat foot [pes planus] (acquired), left foot: Secondary | ICD-10-CM

## 2023-12-03 DIAGNOSIS — M19272 Secondary osteoarthritis, left ankle and foot: Secondary | ICD-10-CM

## 2023-12-03 NOTE — Progress Notes (Signed)
 Patient of Dr. Silva comes in today to pick up her custom molded orthotics. Orthotics were fitted to the sneakers she was wearing. Advised to break them in very slowly, instruction sheet given. Confirmed with patient that we will bill to her insurance. She understands that she will be billed for the difference.

## 2023-12-08 ENCOUNTER — Other Ambulatory Visit: Payer: Self-pay | Admitting: Family

## 2023-12-08 DIAGNOSIS — E039 Hypothyroidism, unspecified: Secondary | ICD-10-CM

## 2024-01-07 ENCOUNTER — Ambulatory Visit: Admitting: Family

## 2024-01-07 ENCOUNTER — Encounter: Payer: Self-pay | Admitting: Emergency Medicine

## 2024-01-07 VITALS — BP 128/74 | HR 78 | Temp 98.0°F | Ht 66.0 in | Wt 282.2 lb

## 2024-01-07 DIAGNOSIS — E559 Vitamin D deficiency, unspecified: Secondary | ICD-10-CM

## 2024-01-07 DIAGNOSIS — Z0000 Encounter for general adult medical examination without abnormal findings: Secondary | ICD-10-CM

## 2024-01-07 DIAGNOSIS — I10 Essential (primary) hypertension: Secondary | ICD-10-CM

## 2024-01-07 DIAGNOSIS — E041 Nontoxic single thyroid nodule: Secondary | ICD-10-CM

## 2024-01-07 DIAGNOSIS — E7800 Pure hypercholesterolemia, unspecified: Secondary | ICD-10-CM

## 2024-01-07 DIAGNOSIS — Z720 Tobacco use: Secondary | ICD-10-CM

## 2024-01-07 DIAGNOSIS — G479 Sleep disorder, unspecified: Secondary | ICD-10-CM

## 2024-01-07 DIAGNOSIS — M0579 Rheumatoid arthritis with rheumatoid factor of multiple sites without organ or systems involvement: Secondary | ICD-10-CM

## 2024-01-07 DIAGNOSIS — M353 Polymyalgia rheumatica: Secondary | ICD-10-CM

## 2024-01-07 LAB — CBC
HCT: 37.1 % (ref 36.0–46.0)
Hemoglobin: 12.1 g/dL (ref 12.0–15.0)
MCHC: 32.7 g/dL (ref 30.0–36.0)
MCV: 89.2 fl (ref 78.0–100.0)
Platelets: 156 K/uL (ref 150.0–400.0)
RBC: 4.16 Mil/uL (ref 3.87–5.11)
RDW: 13.2 % (ref 11.5–15.5)
WBC: 4.6 K/uL (ref 4.0–10.5)

## 2024-01-07 LAB — COMPREHENSIVE METABOLIC PANEL WITH GFR
ALT: 14 U/L (ref 0–35)
AST: 20 U/L (ref 0–37)
Albumin: 3.8 g/dL (ref 3.5–5.2)
Alkaline Phosphatase: 68 U/L (ref 39–117)
BUN: 17 mg/dL (ref 6–23)
CO2: 32 meq/L (ref 19–32)
Calcium: 9.1 mg/dL (ref 8.4–10.5)
Chloride: 105 meq/L (ref 96–112)
Creatinine, Ser: 0.9 mg/dL (ref 0.40–1.20)
GFR: 70.44 mL/min (ref >60.00)
Glucose, Bld: 84 mg/dL (ref 70–99)
Potassium: 3.5 meq/L (ref 3.5–5.1)
Sodium: 143 meq/L (ref 135–145)
Total Bilirubin: 0.7 mg/dL (ref 0.2–1.2)
Total Protein: 6 g/dL (ref 6.0–8.3)

## 2024-01-07 LAB — MICROALBUMIN / CREATININE URINE RATIO
Creatinine,U: 117.8 mg/dL
Microalb Creat Ratio: UNDETERMINED mg/g (ref 0.0–30.0)
Microalb, Ur: <0.7 mg/dL

## 2024-01-07 LAB — LIPID PANEL
Cholesterol: 147 mg/dL (ref 0–200)
HDL: 56.5 mg/dL (ref >39.00)
LDL Cholesterol: 82 mg/dL (ref 0–99)
NonHDL: 90.98
Total CHOL/HDL Ratio: 3
Triglycerides: 44 mg/dL (ref 0.0–149.0)
VLDL: 8.8 mg/dL (ref 0.0–40.0)

## 2024-01-07 LAB — TSH: TSH: 0.89 u[IU]/mL (ref 0.35–5.50)

## 2024-01-07 LAB — VITAMIN D 25 HYDROXY (VIT D DEFICIENCY, FRACTURES): VITD: 26.39 ng/mL — ABNORMAL LOW (ref 30.00–100.00)

## 2024-01-07 MED ORDER — TRAZODONE HCL 50 MG PO TABS: 25.0000 mg | ORAL_TABLET | Freq: Every evening | ORAL | 3 refills | Status: DC | PRN

## 2024-01-07 MED ORDER — GABAPENTIN 300 MG PO CAPS: ORAL_CAPSULE | ORAL | 1 refills | Status: AC

## 2024-01-07 NOTE — Patient Instructions (Signed)
Please work on the following to help with sleep:  -Sleep only long enough to feel rested then get out of bed -Go to bed and get up at the same time every day. -Do not try to force yourself to sleep. If you can't sleep, get out of bed adn try again later. -Have coffee, tea, and other foods that have caffeine only in the morning. -Avoid alcohol -Keep your bedroom dark, cool, quiet, and free of reminders of work or other things that cause you stress -Exercise several days a week, but not right before bed -Avoid looking at phones or reading devices ("e-books") that give off light before bed. This can make it harder to fall asleep  

## 2024-01-07 NOTE — Progress Notes (Unsigned)
 Subjective:  Patient ID: [name] Hughes, female    DOB: [birth year]  Age: 58 y.o. MRN: [id number]  Patient Care Team: Corwin Antu, FNP as PCP - General (Family Medicine) Mai Lynwood FALCON, MD as Consulting Physician (Rheumatology)   CC:  Chief Complaint  Patient presents with   Annual Exam    HPI [name] Hughes is a 58 y.o. female who presents today for an annual physical exam. She reports consuming a general diet. Seeing a personal trainer at the gym She generally feels well. She reports sleeping poorly. She does not have additional problems to discuss today.   Vision:Within last year Dental:No current dental problems sees dentist regularly   Lung Cancer Screening with low-dose Chest CT: ordered lung screening  Mammogram: 11/19/23 Colonoscopy:2019 every ten years   Pt is with acute concerns.   Discussed the use of AI scribe software for clinical note transcription with the patient, who gave verbal consent to proceed.  History of Present Illness [name] is a 58 year old female who presents for an annual physical exam.  She recently had a mammogram on October 17th, 2025, and is up to date with her eye and dental exams, with a dental appointment scheduled for next week.  She has a history of smoking for over 20 years, having stopped during her pregnancy 30 years ago and resumed later. Despite being referred for lung cancer screening in 2023, she has not undergone the screening yet.  She has polymyalgia rheumatica and is on Kevzara for treatment. Her lab levels improved after starting the medication, but she still experiences stiffness and difficulty getting up from a chair. She also mentions emotional eating and weight gain, attributing some of it to past steroid use.  She experiences chronic knee pain, which is very limiting and affects her whole body, although she continues to exercise with a personal trainer. She is trying to lose weight but finds it challenging due to  emotional eating.  She has a history of sleep disturbances, characterized by waking up frequently during the night. Over-the-counter sleep aids like melatonin and Tylenol  PM have been ineffective. She is currently taking gabapentin .  She underwent a hysterectomy and is unsure about menopausal symptoms due to the absence of menstruation. She experienced hot flashes starting at age 81 and weight gain, particularly in the stomach area.  She is not interested in receiving the pneumonia or shingles vaccines and is unsure about her tetanus vaccination status. She has not had an HIV or hepatitis C test recently.  She is on rosuvastatin  and is monitored for kidney function by her rheumatologist. She sees her rheumatologist every six months and has labs done every three months.   Advanced Directives Patient {does/does not:200015} have advanced directives including {BJACP:22709}. She {does/does not:200015} have a copy in the electronic medical record.   DEPRESSION SCREENING    01/07/2024   10:23 AM 01/04/2023    9:08 AM 04/10/2022   11:59 AM 10/01/2021    8:24 AM 07/03/2021   11:00 AM  PHQ 2/9 Scores  PHQ - 2 Score 0 0 2 3 1   PHQ- 9 Score 7 2   10  9       Data saved with a previous flowsheet row definition     ROS: Negative unless specifically indicated above in HPI.    Current Outpatient Medications:    acetaminophen  (TYLENOL ) 500 MG tablet, Take 500 mg by mouth every 6 (six) hours as needed., Disp: , Rfl:  gabapentin  (NEURONTIN ) 300 MG capsule, Take two tablets at bedtime, Disp: 180 capsule, Rfl: 1   levothyroxine  (SYNTHROID ) 100 MCG tablet, TAKE 1 TABLET BY MOUTH DAILY, Disp: 90 tablet, Rfl: 0   Multiple Vitamin (MULTIVITAMIN) tablet, Take 1 tablet by mouth daily., Disp: , Rfl:    polyethylene glycol powder (GLYCOLAX /MIRALAX ) 17 GM/SCOOP powder, Mix 1 capful in a Drink Once daily for Constipation., Disp: , Rfl:    rosuvastatin  (CRESTOR ) 5 MG tablet, Take 1 tablet (5 mg total) by mouth  daily., Disp: 90 tablet, Rfl: 3   Sarilumab (KEVZARA [place]), Inject into the skin., Disp: , Rfl:    valsartan -hydrochlorothiazide  (DIOVAN -HCT) 320-25 MG tablet, TAKE 1 TABLET BY MOUTH DAILY, Disp: 90 tablet, Rfl: 3    Objective:    BP 128/74 (BP Location: Left Arm, Patient Position: Sitting, Cuff Size: Large)   Pulse 78   Temp 98 F (36.7 C) (Temporal)   Ht 5' 6 (1.676 m)   Wt 282 lb 3.2 oz (128 kg)   SpO2 100%   BMI 45.55 kg/m   BP Readings from Last 3 Encounters:  01/07/24 128/74  01/04/23 118/80  08/28/22 110/67      Physical Exam Vitals reviewed.  Constitutional:      General: She is not in acute distress.    Appearance: Normal appearance. She is obese. She is not ill-appearing.  HENT:     Head: Normocephalic.     Right Ear: Tympanic membrane normal.     Left Ear: Tympanic membrane normal.     Nose: Nose normal.     Mouth/Throat:     Mouth: Mucous membranes are moist.  Eyes:     Extraocular Movements: Extraocular movements intact.     Pupils: Pupils are equal, round, and reactive to light.  Cardiovascular:     Rate and Rhythm: Normal rate and regular rhythm.  Pulmonary:     Effort: Pulmonary effort is normal.     Breath sounds: Normal breath sounds.  Abdominal:     General: Abdomen is flat. Bowel sounds are normal.     Palpations: Abdomen is soft.     Tenderness: There is no guarding or rebound.  Musculoskeletal:        General: Normal range of motion.     Cervical back: Normal range of motion.  Skin:    General: Skin is warm.     Capillary Refill: Capillary refill takes less than 2 seconds.  Neurological:     General: No focal deficit present.     Mental Status: She is alert.  Psychiatric:        Mood and Affect: Mood normal.        Behavior: Behavior normal.        Thought Content: Thought content normal.        Judgment: Judgment normal.       Results       Assessment & Plan:   Assessment and Plan Assessment & Plan Adult Wellness  Visit Routine adult wellness visit with no new issues. Mammogram completed on October 17th, 2024. Smoking history present. Discussed lung cancer screening due to smoking history. Declined pneumonia and shingles vaccines. No tetanus vaccine in the last ten years. Discussed tetanus vaccine importance. Declined HIV and hepatitis C testing. Discussed dietary modifications for autoimmune conditions. - Continue routine wellness visits - Encouraged lung cancer screening with low-dose CT scan - Discussed tetanus vaccine importance - Encouraged dietary modifications for autoimmune conditions  Tobacco use Current smoker with a history  of smoking for over 20 years. Discussed increased risk of lung cancer and benefits of lung cancer screening. - Recommended lung cancer screening with low-dose CT scan  Polymyalgia rheumatica and rheumatoid arthritis Chronic polymyalgia rheumatica and rheumatoid arthritis managed with Kevzara. Reports persistent pain and stiffness despite improved lab results. Discussed potential benefits of an anti-inflammatory diet, including a plant-based, gluten-free, pescatarian diet. - Continue Kevzara for rheumatoid arthritis - Encouraged anti-inflammatory diet, including plant-based, gluten-free, pescatarian diet  Pure hypercholesterolemia Managed with rosuvastatin . Monitoring kidney function due to medication use. - Continue rosuvastatin  - Monitor kidney function  Vitamin D  deficiency Vitamin D  deficiency with no current supplementation. Discussed importance of vitamin D  for overall health. - Checked vitamin D  levels - Will consider vitamin D  supplementation if levels are low  Sleep disorder Chronic sleep disorder with difficulty maintaining sleep. Previous trials of over-the-counter sleep aids were ineffective. Discussed potential use of trazodone  and increasing gabapentin  dose for sleep improvement. - Increased gabapentin  dose to 600 mg at bedtime - Will consider trazodone   if gabapentin  adjustment is insufficient  Nontoxic single thyroid  nodule Nontoxic single thyroid  nodule. Plan to check thyroid  levels during routine blood work. - Checked thyroid  levels  Recording duration: 25 minutes        Follow-up: Return in about 6 months (around 07/07/2024) for f/u blood pressure.   Ginger Patrick, FNP

## 2024-01-10 ENCOUNTER — Ambulatory Visit: Payer: Self-pay | Admitting: Family

## 2024-01-10 DIAGNOSIS — E559 Vitamin D deficiency, unspecified: Secondary | ICD-10-CM

## 2024-01-10 MED ORDER — CHOLECALCIFEROL 1.25 MG (50000 UT) PO TABS: 1.0000 | ORAL_TABLET | ORAL | 0 refills | Status: AC

## 2024-01-13 ENCOUNTER — Other Ambulatory Visit: Payer: Self-pay | Admitting: *Deleted

## 2024-01-13 DIAGNOSIS — E7800 Pure hypercholesterolemia, unspecified: Secondary | ICD-10-CM

## 2024-02-12 ENCOUNTER — Other Ambulatory Visit: Payer: Self-pay | Admitting: Family

## 2024-02-12 DIAGNOSIS — I10 Essential (primary) hypertension: Secondary | ICD-10-CM

## 2024-03-06 ENCOUNTER — Other Ambulatory Visit: Payer: Self-pay | Admitting: Family

## 2024-03-06 DIAGNOSIS — E039 Hypothyroidism, unspecified: Secondary | ICD-10-CM

## 2024-07-07 ENCOUNTER — Ambulatory Visit: Admitting: Family
